# Patient Record
Sex: Female | Born: 1995 | Race: White | Hispanic: No | Marital: Single | Smoking: Never smoker
Health system: Southern US, Community
[De-identification: ages and names within clinical notes are randomized; demographics above are authoritative.]

## PROBLEM LIST (undated history)

## (undated) ENCOUNTER — Inpatient Hospital Stay (HOSPITAL_COMMUNITY): Payer: Self-pay

## (undated) DIAGNOSIS — N912 Amenorrhea, unspecified: Secondary | ICD-10-CM

## (undated) DIAGNOSIS — D649 Anemia, unspecified: Secondary | ICD-10-CM

## (undated) DIAGNOSIS — F329 Major depressive disorder, single episode, unspecified: Secondary | ICD-10-CM

## (undated) DIAGNOSIS — F419 Anxiety disorder, unspecified: Secondary | ICD-10-CM

## (undated) DIAGNOSIS — F32A Depression, unspecified: Secondary | ICD-10-CM

## (undated) HISTORY — DX: Anemia, unspecified: D64.9

## (undated) HISTORY — PX: NO PAST SURGERIES: SHX2092

## (undated) HISTORY — DX: Amenorrhea, unspecified: N91.2

## (undated) HISTORY — PX: DILATION AND CURETTAGE OF UTERUS: SHX78

## (undated) HISTORY — DX: Depression, unspecified: F32.A

## (undated) HISTORY — PX: WISDOM TOOTH EXTRACTION: SHX21

## (undated) HISTORY — DX: Anxiety disorder, unspecified: F41.9

## (undated) HISTORY — DX: Major depressive disorder, single episode, unspecified: F32.9

---

## 2018-03-07 NOTE — Progress Notes (Signed)
21 y.o. No obstetric history on file.  CaucasianF here for annual exam.  Period is late.   Cycles were monthly until April, then it was 3 weeks late. Now no cycle since May. No increase in stress, no thyroid c/o, no galactorrhea.  Sexually active, same partner x 1 year, using w/d for contraception. Just in the last 1-2 weeks she has had significant deep cramping pain in her pelvis that lasts for 5-10 minutes.   Period Duration (Days): 3-4 days Period Pattern: (!) Irregular Menstrual Flow: Light, Moderate Menstrual Control: Thin pad, Maxi pad, Tampon Menstrual Control Change Freq (Hours): changes pad/tampon eveyr 2 hours Dysmenorrhea: (!) Moderate Dysmenorrhea Symptoms: Cramping  Changes her products frequently, but not saturated. Can go through a super tampon in 3-4 hours for one day. More moody recently.   Patient's last menstrual period was 01/18/2018 (exact date).          Sexually active: Yes.    The current method of family planning is none.    Exercising: No.  The patient does not participate in regular exercise at present. Smoker:  no  Health Maintenance: Pap:  Never History of abnormal Pap:  no TDaP:  Unsure Gardasil: Completed all 3 per patient   reports that she has never smoked. She has never used smokeless tobacco. She reports that she drank alcohol. She reports that she has current or past drug history.  Past Medical History:  Diagnosis Date  . Amenorrhea   . Anemia   . Anxiety   . Depression   Depression/anxiety is better in the last few years. Anxiety is worse than her depression. Has tried therapy in the past, didn't really help. Has tried medication in the past.   History reviewed. No pertinent surgical history.  Current Outpatient Medications  Medication Sig Dispense Refill  . acetaminophen (TYLENOL) 500 MG tablet Take 500 mg by mouth every 6 (six) hours as needed.    Marland Kitchen. aspirin-acetaminophen-caffeine (EXCEDRIN MIGRAINE) 250-250-65 MG tablet Take 1 tablet by  mouth every 6 (six) hours as needed for headache.    . loratadine (CLARITIN) 10 MG tablet Take 10 mg by mouth daily as needed for allergies.     No current facility-administered medications for this visit.   No migraines, just gets headaches.   Family History  Problem Relation Age of Onset  . Breast cancer Paternal Grandmother   . Cervical cancer Paternal Grandmother     Review of Systems  Constitutional: Negative.   HENT: Negative.   Eyes: Negative.   Respiratory: Negative.   Cardiovascular: Negative.   Gastrointestinal: Positive for nausea and vomiting.  Endocrine: Negative.   Genitourinary:       Painful intercourse amenorrhea  Musculoskeletal: Negative.   Skin: Negative.   Allergic/Immunologic: Negative.   Neurological: Negative.   Hematological: Negative.   Psychiatric/Behavioral: Negative.   She c/o nausea and emesis since [place].   Exam:   BP 109/78 (BP Location: Right Arm, Patient Position: Sitting)   Pulse 68   Ht 5' 5.75" (1.67 m)   Wt 132 lb 3.2 oz (60 kg)   LMP 01/18/2018 (Exact Date)   BMI 21.50 kg/m   Weight change: @WEIGHTCHANGE @ Height:   Height: 5' 5.75" (167 cm)  Ht Readings from Last 3 Encounters:  03/08/18 5' 5.75" (1.67 m)    General appearance: alert, cooperative and appears stated age Head: Normocephalic, without obvious abnormality, atraumatic Neck: no adenopathy, supple, symmetrical, trachea midline and thyroid normal to inspection and palpation Lungs: clear to auscultation  bilaterally Cardiovascular: regular rate and rhythm Breasts: normal appearance, no masses or tenderness Abdomen: soft, non-tender; non distended,  no masses,  no organomegaly Extremities: extremities normal, atraumatic, no cyanosis or edema Skin: Skin color, texture, turgor normal. No rashes or lesions Lymph nodes: Cervical, supraclavicular, and axillary nodes normal. No abnormal inguinal nodes palpated Neurologic: Grossly normal   Pelvic: External genitalia:  no  lesions              Urethra:  normal appearing urethra with no masses, tenderness or lesions              Bartholins and Skenes: normal                 Vagina: normal appearing vagina with normal color and discharge, no lesions              Cervix: no lesions               Bimanual Exam:  Uterus:  anteverted and mobile, not tender, top normal sized              Adnexa: no mass, fullness, tenderness               Rectovaginal: Confirms               Anus:  normal sphincter tone, no lesions  Chaperone was present for exam.  A:  Well Woman with normal exam  Oligomenorrhea  Depression anxiety, better than it was, declines medication or counseling  Pelvic pain after intercourse, normal exam  P:   No pap until 22  Genprobe   UPT +  Will set up an ultrasound depending on HcG levels  BhcG, ABO  She isn't sure if she wants to continue the pregnancy, we discussed referral depending on her desires  STD testing

## 2018-03-08 ENCOUNTER — Other Ambulatory Visit: Payer: Self-pay

## 2018-03-08 ENCOUNTER — Ambulatory Visit (INDEPENDENT_AMBULATORY_CARE_PROVIDER_SITE_OTHER): Payer: BLUE CROSS/BLUE SHIELD | Admitting: Obstetrics and Gynecology

## 2018-03-08 ENCOUNTER — Encounter: Payer: Self-pay | Admitting: Obstetrics and Gynecology

## 2018-03-08 VITALS — BP 109/78 | HR 68 | Ht 65.75 in | Wt 132.2 lb

## 2018-03-08 DIAGNOSIS — Z01419 Encounter for gynecological examination (general) (routine) without abnormal findings: Secondary | ICD-10-CM

## 2018-03-08 DIAGNOSIS — Z3201 Encounter for pregnancy test, result positive: Secondary | ICD-10-CM | POA: Diagnosis not present

## 2018-03-08 DIAGNOSIS — N941 Unspecified dyspareunia: Secondary | ICD-10-CM | POA: Diagnosis not present

## 2018-03-08 DIAGNOSIS — Z113 Encounter for screening for infections with a predominantly sexual mode of transmission: Secondary | ICD-10-CM | POA: Diagnosis not present

## 2018-03-08 DIAGNOSIS — N912 Amenorrhea, unspecified: Secondary | ICD-10-CM

## 2018-03-08 LAB — POCT URINE PREGNANCY: Preg Test, Ur: POSITIVE — AB

## 2018-03-09 ENCOUNTER — Other Ambulatory Visit: Payer: Self-pay

## 2018-03-09 ENCOUNTER — Telehealth: Payer: Self-pay | Admitting: *Deleted

## 2018-03-09 ENCOUNTER — Telehealth: Payer: Self-pay | Admitting: Obstetrics and Gynecology

## 2018-03-09 ENCOUNTER — Encounter (HOSPITAL_COMMUNITY): Payer: Self-pay | Admitting: *Deleted

## 2018-03-09 ENCOUNTER — Other Ambulatory Visit: Payer: Self-pay | Admitting: *Deleted

## 2018-03-09 ENCOUNTER — Inpatient Hospital Stay (HOSPITAL_COMMUNITY)
Admission: AD | Admit: 2018-03-09 | Discharge: 2018-03-09 | Disposition: A | Discharge status: Home / Self Care | Payer: BLUE CROSS/BLUE SHIELD | Source: Ambulatory Visit | Attending: Obstetrics & Gynecology | Admitting: Obstetrics & Gynecology

## 2018-03-09 DIAGNOSIS — O210 Mild hyperemesis gravidarum: Secondary | ICD-10-CM | POA: Diagnosis not present

## 2018-03-09 DIAGNOSIS — Z3201 Encounter for pregnancy test, result positive: Secondary | ICD-10-CM

## 2018-03-09 DIAGNOSIS — Z3A01 Less than 8 weeks gestation of pregnancy: Secondary | ICD-10-CM | POA: Diagnosis not present

## 2018-03-09 DIAGNOSIS — N912 Amenorrhea, unspecified: Secondary | ICD-10-CM

## 2018-03-09 DIAGNOSIS — O209 Hemorrhage in early pregnancy, unspecified: Secondary | ICD-10-CM

## 2018-03-09 DIAGNOSIS — O219 Vomiting of pregnancy, unspecified: Secondary | ICD-10-CM | POA: Diagnosis not present

## 2018-03-09 LAB — HEPATITIS C ANTIBODY: Hep C Virus Ab: <0.1 s/co ratio (ref 0.0–0.9)

## 2018-03-09 LAB — BETA HCG QUANT (REF LAB): HCG QUANT: 27801 m[IU]/mL

## 2018-03-09 LAB — CBC
HCT: 37 % (ref 36.0–46.0)
HEMOGLOBIN: 12.8 g/dL (ref 12.0–15.0)
MCH: 31.6 pg (ref 26.0–34.0)
MCHC: 34.6 g/dL (ref 30.0–36.0)
MCV: 91.4 fL (ref 78.0–100.0)
PLATELETS: 264 10*3/uL (ref 150–400)
RBC: 4.05 MIL/uL (ref 3.87–5.11)
RDW: 12 % (ref 11.5–15.5)
WBC: 11.2 10*3/uL — AB (ref 4.0–10.5)

## 2018-03-09 LAB — GC/CHLAMYDIA PROBE AMP
CHLAMYDIA, DNA PROBE: NEGATIVE
NEISSERIA GONORRHOEAE BY PCR: NEGATIVE

## 2018-03-09 LAB — URINALYSIS, ROUTINE W REFLEX MICROSCOPIC
BILIRUBIN URINE: NEGATIVE
Glucose, UA: NEGATIVE mg/dL
Hgb urine dipstick: NEGATIVE
KETONES UR: 20 mg/dL — AB
LEUKOCYTES UA: NEGATIVE
NITRITE: NEGATIVE
PH: 6 (ref 5.0–8.0)
PROTEIN: NEGATIVE mg/dL
Specific Gravity, Urine: 1.015 (ref 1.005–1.030)

## 2018-03-09 LAB — ABO AND RH: RH TYPE: NEGATIVE

## 2018-03-09 LAB — HEP, RPR, HIV PANEL
HIV SCREEN 4TH GENERATION: NONREACTIVE
Hepatitis B Surface Ag: NEGATIVE
RPR Ser Ql: NONREACTIVE

## 2018-03-09 LAB — ANTIBODY SCREEN: Antibody Screen: NEGATIVE

## 2018-03-09 MED ORDER — PROMETHAZINE HCL 25 MG/ML IJ SOLN
25.0000 mg | Freq: Once | INTRAVENOUS | Status: AC
  Administered 2018-03-09: 25 mg via INTRAVENOUS
  Filled 2018-03-09: qty 1

## 2018-03-09 MED ORDER — DOXYLAMINE-PYRIDOXINE ER 20-20 MG PO TBCR: 1.0000 | EXTENDED_RELEASE_TABLET | Freq: Every day | ORAL | 0 refills | Status: DC

## 2018-03-09 MED ORDER — M.V.I. ADULT IV INJ
INJECTION | Freq: Once | INTRAVENOUS | Status: AC
  Administered 2018-03-09: 16:00:00 via INTRAVENOUS
  Filled 2018-03-09: qty 1000

## 2018-03-09 NOTE — MAU Note (Signed)
Pt presents with c/o N&V, states unable to keep anything down since Monday.  Reports has vomited @ least 10 times in 24 hours.  Hasn't taken any meds, Rx sent today by Dr. Oscar LaJertson. Denies VB or abdominal pain/cramping.

## 2018-03-09 NOTE — Telephone Encounter (Signed)
Notes recorded by Leda MinHamm, Clearence Vitug N, RN on 03/09/2018 at 9:07 AM EDT Spoke with patient, advised as seen below per Dr. Oscar LaJertson. PUS scheduled for 03/10/18 at 2:30 pm with consult to follow. Patient states she is undecided about plans for pregnancy, was waiting to confirm how far along she is, will plan to discuss further at OV on 7/11. Patient reports nausea and vomiting 5-6 times per day, unable to eat small meals or keep fluids down. Patient requesting RX for nausea. Advised will review with Dr. Oscar LaJertson and return call with recommendations, patient agreeable. See telephone encounter dated 7/10 to review with provider.   Order placed for PUS.      Dr. Oscar LaJertson -please review and advise on N/V.

## 2018-03-09 NOTE — Discharge Instructions (Signed)

## 2018-03-09 NOTE — MAU Provider Note (Signed)
History     CSN: [id number]  Arrival date and time: 03/09/18 1335   First Provider Initiated Contact with Patient 03/09/18 1510      Chief Complaint  Patient presents with  . Emesis  . Nausea   HPI  Ms.  Shelby Perez is a 22 y.o. year old 621P0000 female at 165w1d weeks gestation who presents to MAU reporting N/V and unable to keep anything down since Monday. She reports vomiting 10 times in 24 hrs and a 2 lb weight loss since her appt yesterday. She was Rx'd antiemetic medications, but has not had a chance to get them. She denies VB or abdominal pain/cramping. She is scheduled for an U/S in Dr. Salli QuarryJertson's office tomorrow (03/10/18).   Past Medical History:  Diagnosis Date  . Amenorrhea   . Anemia   . Anxiety   . Depression     Past Surgical History:  Procedure Laterality Date  . NO PAST SURGERIES      Family History  Problem Relation Age of Onset  . Breast cancer Paternal Grandmother   . Cervical cancer Paternal Grandmother     Social History   Tobacco Use  . Smoking status: Never Smoker  . Smokeless tobacco: Never Used  Substance Use Topics  . Alcohol use: Not Currently  . Drug use: Not Currently    Allergies: Not on File  Medications Prior to Admission  Medication Sig Dispense Refill Last Dose  . acetaminophen (TYLENOL) 500 MG tablet Take 500 mg by mouth every 6 (six) hours as needed.   Taking  . aspirin-acetaminophen-caffeine (EXCEDRIN MIGRAINE) 250-250-65 MG tablet Take 1 tablet by mouth every 6 (six) hours as needed for headache.   Taking  . Doxylamine-Pyridoxine ER (BONJESTA) 20-20 MG TBCR Take 1 tablet by mouth at bedtime. Increase to one tablet BID 60 tablet 0   . loratadine (CLARITIN) 10 MG tablet Take 10 mg by mouth daily as needed for allergies.   Taking    Review of Systems  Constitutional: Positive for fatigue and unexpected weight change ("was 132 lbs yesterday").  HENT: Negative.   Eyes: Negative.   Respiratory: Negative.    Cardiovascular: Negative.   Gastrointestinal: Positive for nausea and vomiting ("10 in 24 hrs; unable to keep anything down").  Endocrine: Negative.   Genitourinary: Negative.   Musculoskeletal: Negative.   Skin: Negative.   Allergic/Immunologic: Negative.   Neurological: Negative.   Hematological: Negative.   Psychiatric/Behavioral: Negative.    Physical Exam   Blood pressure 114/68, pulse 79, temperature 98.4 F (36.9 C), temperature source Oral, resp. rate 20, height 5\' 5"  (1.651 m), weight 130 lb (59 kg), last menstrual period 01/18/2018, SpO2 98 %.  Physical Exam  Nursing note and vitals reviewed. Constitutional: She is oriented to person, place, and time. She appears well-developed.  HENT:  Head: Normocephalic and atraumatic.  Eyes: Pupils are equal, round, and reactive to light.  Neck: Normal range of motion.  Cardiovascular: Normal rate, regular rhythm and normal heart sounds.  Respiratory: Effort normal and breath sounds normal.  GI: Soft. Normal appearance. Bowel sounds are decreased. There is no tenderness.  Genitourinary:  Genitourinary Comments: Pelvic deferred  Musculoskeletal: Normal range of motion.  Neurological: She is alert and oriented to person, place, and time.  Skin: Skin is warm and dry. There is pallor.  Psychiatric: She has a normal mood and affect. Her behavior is normal. Judgment and thought content normal.    MAU Course  Procedures  MDM CCUA CBC IVFs: Phenergan  25 mg in D5LR 1000 ml @ bolus rate; then MVI in LR 1000 ml @ 500 ml/hr  *Consult with Dr. Seymour Bars @ [phone] - notified of patient's complaints, assessments, lab results, tx plan d/c home, F/U with  - ok to d/c home, agrees with plan   Results for orders placed or performed during the hospital encounter of 03/09/18 (from the past 24 hour(s))  CBC     Status: Abnormal   Collection Time: 03/09/18  2:03 PM  Result Value Ref Range   WBC 11.2 (H) 4.0 - 10.5 K/uL   RBC 4.05 3.87 - 5.11  MIL/uL   Hemoglobin 12.8 12.0 - 15.0 g/dL   HCT 21.3 08.6 - 57.8 %   MCV 91.4 78.0 - 100.0 fL   MCH 31.6 26.0 - 34.0 pg   MCHC 34.6 30.0 - 36.0 g/dL   RDW 46.9 62.9 - 52.8 %   Platelets 264 150 - 400 K/uL  Urinalysis, Routine w reflex microscopic     Status: Abnormal   Collection Time: 03/09/18  2:39 PM  Result Value Ref Range   Color, Urine YELLOW YELLOW   APPearance CLEAR CLEAR   Specific Gravity, Urine 1.015 1.005 - 1.030   pH 6.0 5.0 - 8.0   Glucose, UA NEGATIVE NEGATIVE mg/dL   Hgb urine dipstick NEGATIVE NEGATIVE   Bilirubin Urine NEGATIVE NEGATIVE   Ketones, ur 20 (A) NEGATIVE mg/dL   Protein, ur NEGATIVE NEGATIVE mg/dL   Nitrite NEGATIVE NEGATIVE   Leukocytes, UA NEGATIVE NEGATIVE   WBC, UA 0-5 0 - 5 WBC/hpf   Bacteria, UA RARE (A) NONE SEEN   Squamous Epithelial / LPF 0-5 0 - 5   Mucus PRESENT       Assessment and Plan  Nausea and vomiting during pregnancy prior to [redacted] weeks gestation  - Information provided on morning sickness & HG - Advised to keep U/S appt tomorrow with Dr. Oscar La on 03/10/18 - Discharge patient - Patient verbalized an understanding of the plan of care and agrees.     Raelyn Mora MSN, CNM 03/09/2018, 2:40 PM

## 2018-03-09 NOTE — Telephone Encounter (Signed)
Spoke with patient, advised as seen below per Dr. Oscar LaJertson. Patient states she will go to MAU for evaluation today when she can find a driver. Rx for Bonjesta to verified pharmacy. Patient verbalizes understanding and is agreeable.   Routing to provider for final review. Patient is agreeable to disposition. Will close encounter.

## 2018-03-09 NOTE — Telephone Encounter (Signed)
-----   Message from Romualdo BolkJill Evelyn Jertson, MD sent at 03/09/2018  8:35 AM EDT ----- Please let the patient know that her BhcG is high enough for an ultrasound to determine how far along she is. Given her irregular bleeding in April and May she should return for an ultrasound (please get her in tomorrow). She if she has made a decision about continuing the pregnancy, please make sure the ultrasound tech is aware of her plans or indecision. Thanks She is RH -, if she has any further bleeding, she will need Rhogam. Her blood work for STD testing was negative, the genprobe is pending.

## 2018-03-09 NOTE — Telephone Encounter (Signed)
If she can't keep any fluids down, she should go to the MAU to be hydrated and given medication. She should eat a bland diet, try and stay hydrated and eat many small meals a day.  She can start on Bonjesta 1 tablet at bedtime tonight and increase to one tablet BID, #60 no refills. If she won't go to the MAU, you can call in phenergan 12.5 mg, 1-2 tablets po q 4 hours prn, # 20, no refills. She shouldn't take the medications together.

## 2018-03-09 NOTE — Telephone Encounter (Signed)
Call placed to patient to review benefits or scheduled ultrasound. Left voicemail message requesting a return call.

## 2018-03-10 ENCOUNTER — Ambulatory Visit (INDEPENDENT_AMBULATORY_CARE_PROVIDER_SITE_OTHER): Payer: BLUE CROSS/BLUE SHIELD | Admitting: Obstetrics and Gynecology

## 2018-03-10 ENCOUNTER — Other Ambulatory Visit: Payer: Self-pay

## 2018-03-10 ENCOUNTER — Encounter: Payer: Self-pay | Admitting: Obstetrics and Gynecology

## 2018-03-10 ENCOUNTER — Other Ambulatory Visit: Payer: Self-pay | Admitting: Obstetrics and Gynecology

## 2018-03-10 ENCOUNTER — Ambulatory Visit (INDEPENDENT_AMBULATORY_CARE_PROVIDER_SITE_OTHER): Payer: BLUE CROSS/BLUE SHIELD

## 2018-03-10 VITALS — BP 110/78 | HR 80 | Wt 131.0 lb

## 2018-03-10 DIAGNOSIS — N912 Amenorrhea, unspecified: Secondary | ICD-10-CM | POA: Diagnosis not present

## 2018-03-10 DIAGNOSIS — Z3490 Encounter for supervision of normal pregnancy, unspecified, unspecified trimester: Secondary | ICD-10-CM

## 2018-03-10 DIAGNOSIS — Z3201 Encounter for pregnancy test, result positive: Secondary | ICD-10-CM

## 2018-03-10 NOTE — Progress Notes (Signed)
GYNECOLOGY  VISIT   HPI: 22 y.o.   Single  Caucasian  female   G1P0000 with Patient's last menstrual period was 01/18/2018 (exact date).   here for viability [place] consult.  Went to MAU yesterday for nausea and vomiting. Was given IV fluid. Denies nausea and vomiting today. She is still trying to decide if she wants to continue the pregnancy. She is leaning toward having an abortion.   GYNECOLOGIC HISTORY: Patient's last menstrual period was 01/18/2018 (exact date). Contraception: None Menopausal hormone therapy: none        OB History    Gravida  1   Para  0   Term  0   Preterm  0   AB  0   Living  0     SAB  0   TAB  0   Ectopic  0   Multiple  0   Live Births  0              Patient Active Problem List   Diagnosis Date Noted  . Nausea and vomiting during pregnancy prior to [redacted] weeks gestation 03/09/2018    Past Medical History:  Diagnosis Date  . Amenorrhea   . Anemia   . Anxiety   . Depression     Past Surgical History:  Procedure Laterality Date  . NO PAST SURGERIES      Current Outpatient Medications  Medication Sig Dispense Refill  . acetaminophen (TYLENOL) 500 MG tablet Take 500 mg by mouth every 6 (six) hours as needed.    . Doxylamine-Pyridoxine ER (BONJESTA) 20-20 MG TBCR Take 1 tablet by mouth at bedtime. Increase to one tablet BID 60 tablet 0  . loratadine (CLARITIN) 10 MG tablet Take 10 mg by mouth daily as needed for allergies.     No current facility-administered medications for this visit.      ALLERGIES: Patient has no allergy information on record.  Family History  Problem Relation Age of Onset  . Breast cancer Paternal Grandmother   . Cervical cancer Paternal Grandmother     Social History   Socioeconomic History  . Marital status: Single    Spouse name: Not on file  . Number of children: Not on file  . Years of education: Not on file  . Highest education level: Not on file  Occupational History  . Not on file   Social Needs  . Financial resource strain: Not on file  . Food insecurity:    Worry: Not on file    Inability: Not on file  . Transportation needs:    Medical: Not on file    Non-medical: Not on file  Tobacco Use  . Smoking status: Never Smoker  . Smokeless tobacco: Never Used  Substance and Sexual Activity  . Alcohol use: Not Currently  . Drug use: Not Currently  . Sexual activity: Yes    Birth control/protection: None  Lifestyle  . Physical activity:    Days per week: Not on file    Minutes per session: Not on file  . Stress: Not on file  Relationships  . Social connections:    Talks on phone: Not on file    Gets together: Not on file    Attends religious service: Not on file    Active member of club or organization: Not on file    Attends meetings of clubs or organizations: Not on file    Relationship status: Not on file  . Intimate partner violence:  Fear of current or ex partner: Not on file    Emotionally abused: Not on file    Physically abused: Not on file    Forced sexual activity: Not on file  Other Topics Concern  . Not on file  Social History Narrative  . Not on file    Review of Systems  Constitutional: Negative.   HENT: Negative.   Eyes: Negative.   Respiratory: Negative.   Cardiovascular: Negative.   Gastrointestinal: Negative.   Genitourinary: Negative.   Musculoskeletal: Negative.   Skin: Negative.   Neurological: Negative.   Endo/Heme/Allergies: Negative.   Psychiatric/Behavioral: Negative.     PHYSICAL EXAMINATION:    LMP 01/18/2018 (Exact Date)     General appearance: alert, cooperative and appears stated age  Ultrasound with viable IUP  ASSESSMENT Viable IUP, patient unsure about continuing the pregnancy. Desires abortion counseling    [facility]AN [facility] for possible abortion. If she decides to continue the pregnancy she can do that there as well Aware to be on PNV unless she decides to  terminate   An After Visit Summary was printed and given to the patient.

## 2018-03-10 NOTE — Telephone Encounter (Signed)
Patient returning Suzy's call. °

## 2018-03-10 NOTE — Telephone Encounter (Signed)
Spoke with patient in regard to benefits for scheduled ultrasound appointment. Patient understood information presented. Patient is scheduled today 03/10/18 with Dr Oscar LaJertson. Patient advise she is on her way to the appointment now. Ok to close encounter

## 2018-03-15 ENCOUNTER — Telehealth: Payer: Self-pay | Admitting: Obstetrics and Gynecology

## 2018-03-15 NOTE — Telephone Encounter (Signed)
Spoke with patient, has decided to continue with her pregnancy, asking what to do next?   Advised patient she will need to establish care with OBGYN as soon as possible. Will place copy of list of providers in mail for her to review.   Dr. Oscar LaJertson will review, I will return call with any additional recommendations. Patient verbalizes understanding.   Routing to provider for final review. Patient is agreeable to disposition. Will close encounter.

## 2018-03-15 NOTE — Telephone Encounter (Signed)
Patient called to speak with the nurse. She said she is planning on proceeding with her pregnancy and she'd like some guidance.

## 2019-01-17 ENCOUNTER — Encounter (HOSPITAL_COMMUNITY): Payer: Self-pay

## 2019-09-01 NOTE — L&D Delivery Note (Signed)
Delivery Note   Patient Name: Berenis Kohles DOB: 03/08/1996 MRN: [id number]  Date of admission: 05/18/2020 Delivering MD: Dale Reading  Date of delivery: 05/18/20 Type of delivery: SVD  Newborn Data: Live born female  Birth Weight:   APGAR: 6, 9  Newborn Delivery   Birth date/time: 05/18/2020 21:35:00 Delivery type: Vaginal, Spontaneous    Giulliana Ladouceur, 23 y.o., @ [redacted]w[redacted]d,  [id number], who was admitted for spontaneous labor with IOL for postdates. I was called to the room when she progressed 2+ station in the second stage of labor.  DR Su Hilt called and in route for vacum assessment at bedside for deep variables, amnio infusing at 185ml/hr. She pushed for 30/min.  She delivered a viable infant, cephalic and restituted to the OA position over an intact perineum.  A body cord   was identified, loose and reduced. The baby was placed on maternal abdomen while initial step of NRP were perfmored (Dry, Stimulated, and warmed). Hat placed on baby for thermoregulation. Delayed cord clamping was performed for 2 mins.  Cord double clamped and cut.  Cord cut by father. Apgar scores were 6 and 9. Prophylactic Pitocin was started in the third stage of labor for active management. The placenta delivered spontaneously, duncan, with a 2 vessel cord and was sent to pathology. Brisk bleeding noted, fundal massage revieled boggy uterus, clotts noted to form in uterus, foley removed, TXA was hung manual exploration performed placenta fragments noted and expelled, IM methergine then given, Unasyn 3gm started,   Inspection revealed 2nd degree. An examination of the vaginal vault and cervix was free from lacerations. The uterus was firm, bleeding stable.  The repair was done under epidural.   Umbilical artery blood gas were not sent. But NICU called to be present for delivery.  There were no complications during the procedure.  Mom and baby skin to skin following delivery. Left in stable condition.  Maternal  Info: Anesthesia: Epidural Episiotomy: no Lacerations:  2nd degree Suture Repair: 3.0 vicryl CT Est. Blood Loss (mL):   Newborn Info:  Baby Sex: Baby Female Circumcision: in pt desired  APGAR (1 MIN): 6   APGAR (5 MINS): 9   APGAR (10 MINS):     Mom to postpartum.  Baby to Couplet care / Skin to Skin.  DR Su Hilt present after delivery.   [place], [place], NP-C 05/18/20 10:24 PM

## 2019-09-18 ENCOUNTER — Telehealth: Payer: Self-pay | Admitting: Obstetrics and Gynecology

## 2019-09-18 NOTE — Telephone Encounter (Signed)
Spoke to pt. Pt is newly pregnant around 7 wks.   LMP 08/02/2019. Pt states waiting to get an appt into OBGYN at [facility]. Pt states having nausea for last 6 weeks and today has no sx at all. Pt had "silent miscarriage in past" and is very worried and needs confirmation. Would like to see Dr Oscar La. Pt scheduled for OV 09/19/2019 at 1pm with Dr Oscar La. Pt agreeable and thankful for appt. Pt has Negative CPS.   Will route to Dr Oscar La for review and will close encounter.

## 2019-09-18 NOTE — Telephone Encounter (Signed)
Patient is newly pregnant and has some questions for Dr.Jerton about her "symtoms".

## 2019-09-19 ENCOUNTER — Telehealth: Payer: Self-pay

## 2019-09-19 ENCOUNTER — Ambulatory Visit (INDEPENDENT_AMBULATORY_CARE_PROVIDER_SITE_OTHER): Payer: 59 | Admitting: Obstetrics and Gynecology

## 2019-09-19 ENCOUNTER — Other Ambulatory Visit (INDEPENDENT_AMBULATORY_CARE_PROVIDER_SITE_OTHER): Payer: Medicaid Other

## 2019-09-19 ENCOUNTER — Other Ambulatory Visit: Payer: Medicaid Other

## 2019-09-19 ENCOUNTER — Other Ambulatory Visit: Payer: Self-pay

## 2019-09-19 ENCOUNTER — Encounter: Payer: Self-pay | Admitting: Obstetrics and Gynecology

## 2019-09-19 VITALS — BP 100/64 | HR 64 | Temp 98.4°F | Ht 65.5 in | Wt 125.0 lb

## 2019-09-19 DIAGNOSIS — N926 Irregular menstruation, unspecified: Secondary | ICD-10-CM

## 2019-09-19 DIAGNOSIS — O219 Vomiting of pregnancy, unspecified: Secondary | ICD-10-CM | POA: Diagnosis not present

## 2019-09-19 DIAGNOSIS — Z3201 Encounter for pregnancy test, result positive: Secondary | ICD-10-CM | POA: Diagnosis not present

## 2019-09-19 DIAGNOSIS — O26851 Spotting complicating pregnancy, first trimester: Secondary | ICD-10-CM

## 2019-09-19 DIAGNOSIS — R7989 Other specified abnormal findings of blood chemistry: Secondary | ICD-10-CM

## 2019-09-19 LAB — POCT URINE PREGNANCY: Preg Test, Ur: POSITIVE — AB

## 2019-09-19 MED ORDER — PROMETHAZINE HCL 12.5 MG PO TABS: ORAL_TABLET | ORAL | 0 refills | Status: DC

## 2019-09-19 NOTE — Progress Notes (Signed)
GYNECOLOGY  VISIT   HPI: 24 y.o.   Single White or Caucasian Not Hispanic or Latino  female   G1P0000 with Patient's last menstrual period was 08/01/2018.   here for   Positive pregnancy test. She states that she has lost some weight and nausea 2-3 times a day. She had vomited 3-4 x in the last week.  Cycles are every 35 days, + pregnancy test ~ 1 week ago. No bleeding, occasional mild cramps. She had spotting with intercourse a week ago. Stopped immediately. No h/o STD.   She is very excited.  She had a missed AB and D&C in 8/19.  GYNECOLOGIC HISTORY: Patient's last menstrual period was 08/01/2018. Contraception:none  Menopausal hormone therapy: none        OB History    Gravida  1   Para  0   Term  0   Preterm  0   AB  0   Living  0     SAB  0   TAB  0   Ectopic  0   Multiple  0   Live Births  0              Patient Active Problem List   Diagnosis Date Noted  . Nausea and vomiting during pregnancy prior to [redacted] weeks gestation 03/09/2018    Past Medical History:  Diagnosis Date  . Amenorrhea   . Anemia   . Anxiety   . Depression     Past Surgical History:  Procedure Laterality Date  . NO PAST SURGERIES      No current outpatient medications on file.   No current facility-administered medications for this visit.     ALLERGIES: Patient has no allergy information on record.  Family History  Problem Relation Age of Onset  . Breast cancer Paternal Grandmother   . Cervical cancer Paternal Grandmother     Social History   Socioeconomic History  . Marital status: Single    Spouse name: Not on file  . Number of children: Not on file  . Years of education: Not on file  . Highest education level: Not on file  Occupational History  . Not on file  Tobacco Use  . Smoking status: Never Smoker  . Smokeless tobacco: Never Used  Substance and Sexual Activity  . Alcohol use: Not Currently  . Drug use: Not Currently  . Sexual activity: Yes   Birth control/protection: None  Other Topics Concern  . Not on file  Social History Narrative  . Not on file   Social Determinants of Health   Financial Resource Strain:   . Difficulty of Paying Living Expenses: Not on file  Food Insecurity:   . Worried About Charity fundraiser in the Last Year: Not on file  . Ran Out of Food in the Last Year: Not on file  Transportation Needs:   . Lack of Transportation (Medical): Not on file  . Lack of Transportation (Non-Medical): Not on file  Physical Activity:   . Days of Exercise per Week: Not on file  . Minutes of Exercise per Session: Not on file  Stress:   . Feeling of Stress : Not on file  Social Connections:   . Frequency of Communication with Friends and Family: Not on file  . Frequency of Social Gatherings with Friends and Family: Not on file  . Attends Religious Services: Not on file  . Active Member of Clubs or Organizations: Not on file  . Attends Club  or Organization Meetings: Not on file  . Marital Status: Not on file  Intimate Partner Violence:   . Fear of Current or Ex-Partner: Not on file  . Emotionally Abused: Not on file  . Physically Abused: Not on file  . Sexually Abused: Not on file    Review of Systems  Constitutional:       Fatigue  Gastrointestinal: Positive for constipation, nausea and vomiting.  All other systems reviewed and are negative.   PHYSICAL EXAMINATION:    BP 100/64   Pulse 64   Temp 98.4 F (36.9 C)   Ht 5' 5.5" (1.664 m)   Wt 125 lb (56.7 kg)   LMP 08/01/2018   SpO2 98%   BMI 20.48 kg/m     General appearance: alert, cooperative and appears stated age Abdomen: soft, non-tender; non distended, no masses,  no organomegaly  Pelvic: External genitalia:  no lesions              Urethra:  normal appearing urethra with no masses, tenderness or lesions              Bartholins and Skenes: normal                 Vagina: normal appearing vagina with normal color and discharge, no lesions               Cervix: no lesions and not friable              Bimanual Exam:  Uterus:  retroverted, mobile, 8 week sized, not tender              Adnexa: no mass, fullness, tenderness               Chaperone was present for exam.  ASSESSMENT +pregnancy test 5+5 weeks based on LMP and 35 day cycles (not always), uterus feels enlarged.  Post coital spotting x 1 a week ago, cervix not friable Nausea and emesis   PLAN Check BhcG today, then depending on results schedule ultrasound. Blood type O-, rhogam not indicated for spotting. Given information on nausea and emesis in pregnancy, avoiding triggers, staying hydrated, vit B12 and unisom Phenergan sent in for prn use    An After Visit Summary was printed and given to the patient.

## 2019-09-19 NOTE — Telephone Encounter (Signed)
Spoke with patient. Due to PA needed for ultrasound in office reviewed with Dr.Jertson who recommends that the patient come in for a beta hcg quant only today. After results return we can make further plans for ultrasound if needed. Ultrasound for today cancelled. Patient is agreeable. Lab appointment scheduled for today 09/19/2019 at 3:45 pm. Patient is agreeable to date and time. Order for lab placed.  Routing to provider and will close encounter.

## 2019-09-20 ENCOUNTER — Telehealth: Payer: Self-pay | Admitting: *Deleted

## 2019-09-20 ENCOUNTER — Encounter: Payer: Self-pay | Admitting: Obstetrics and Gynecology

## 2019-09-20 ENCOUNTER — Other Ambulatory Visit: Payer: Self-pay | Admitting: *Deleted

## 2019-09-20 DIAGNOSIS — O219 Vomiting of pregnancy, unspecified: Secondary | ICD-10-CM

## 2019-09-20 DIAGNOSIS — Z3201 Encounter for pregnancy test, result positive: Secondary | ICD-10-CM

## 2019-09-20 DIAGNOSIS — N926 Irregular menstruation, unspecified: Secondary | ICD-10-CM

## 2019-09-20 DIAGNOSIS — O26851 Spotting complicating pregnancy, first trimester: Secondary | ICD-10-CM

## 2019-09-20 LAB — BETA HCG QUANT (REF LAB): hCG Quant: 13162 m[IU]/mL

## 2019-09-20 NOTE — Telephone Encounter (Signed)
Call placed to [facility] main radiology scheduling, spoke with Charmella. Was advised OB ultrasounds are now scheduled at [address] location.   Ultrasound scheduled for 09/27/19 at 9am, arrive at 8:45am. Patient needs to arrive with full bladder.

## 2019-09-20 NOTE — Telephone Encounter (Signed)
Call to patient, no answer, unable to leave voicemail. No alternative number.

## 2019-09-20 NOTE — Telephone Encounter (Signed)
Leda Min, RN  09/20/2019 10:44 AM EST    Spoke with patient, advised as seen below per Dr. Oscar La. Patient agreeable to proceed with PUS at [facility]. Advised patient I will call to schedule and return call with appt details. Patient agreeable.

## 2019-09-20 NOTE — Telephone Encounter (Signed)
-----   Message from Romualdo Bolk, MD sent at 09/20/2019 10:30 AM EST ----- Please inform the patient that her BhcG is in a good range and set her up for an ultrasound

## 2019-09-21 ENCOUNTER — Ambulatory Visit (HOSPITAL_COMMUNITY)
Admission: RE | Admit: 2019-09-21 | Discharge: 2019-09-21 | Disposition: A | Discharge status: Home / Self Care | Payer: 59 | Source: Ambulatory Visit | Attending: Obstetrics and Gynecology | Admitting: Obstetrics and Gynecology

## 2019-09-21 ENCOUNTER — Telehealth: Payer: Self-pay | Admitting: *Deleted

## 2019-09-21 ENCOUNTER — Other Ambulatory Visit: Payer: Self-pay

## 2019-09-21 DIAGNOSIS — N926 Irregular menstruation, unspecified: Secondary | ICD-10-CM

## 2019-09-21 DIAGNOSIS — O26851 Spotting complicating pregnancy, first trimester: Secondary | ICD-10-CM

## 2019-09-21 DIAGNOSIS — O219 Vomiting of pregnancy, unspecified: Secondary | ICD-10-CM | POA: Diagnosis present

## 2019-09-21 NOTE — Telephone Encounter (Signed)
Spoke with patient. Patient notified of ultrasound appt as seen below. Patient verbalizes understanding and is agreeable.   Routing to provider for final review. Patient is agreeable to disposition. Will close encounter.  Cc: Harland Dingwall, Soundra Pilon

## 2019-09-21 NOTE — Telephone Encounter (Signed)
Call to patient, advised per Dr. Oscar La. Advised RN will call to reschedule and return call with appt information. Patient agreeable.   Call placed to [facility] Main scheduling, spoke with Darl Pikes. OB US r/s to today at 1pm at [facility] outpatient [place] located at [address]. Arrive at 12:45pm with full bladder. Business office notified.   Call to patient, advised of new [place] appt and details, patient verbalizes understanding and is agreeable.   Routing to Dr. Oscar La  Cc: Harland Dingwall, Soundra Pilon

## 2019-09-21 NOTE — Telephone Encounter (Signed)
She had slight spotting last week. I don't think she should wait a week for a scan, please try and get her in today if possible.

## 2019-09-21 NOTE — Telephone Encounter (Signed)
Leda Min, RN  09/21/2019 4:18 PM EST    Call to patient, no answer, voicemail not set up. No alternative number on file, MyChart not active.

## 2019-09-21 NOTE — Telephone Encounter (Signed)
-----   Message from Romualdo Bolk, MD sent at 09/21/2019  2:30 PM EST ----- Please inform the patient that her ultrasound was normal and have her schedule an appointment with OB.

## 2019-09-21 NOTE — Addendum Note (Signed)
Addended by: Leda Min on: 09/21/2019 10:54 AM   Modules accepted: Orders

## 2019-09-22 NOTE — Telephone Encounter (Signed)
Spoke with patient, advised per Dr. Jertson. Patient verbalizes understanding and is agreeable.   Encounter closed.  

## 2019-09-27 ENCOUNTER — Other Ambulatory Visit (HOSPITAL_COMMUNITY): Payer: BLUE CROSS/BLUE SHIELD

## 2019-11-02 LAB — OB RESULTS CONSOLE RPR: RPR: NONREACTIVE

## 2019-11-02 LAB — OB RESULTS CONSOLE RUBELLA ANTIBODY, IGM: Rubella: IMMUNE

## 2019-11-02 LAB — OB RESULTS CONSOLE GC/CHLAMYDIA
Chlamydia: NEGATIVE
Gonorrhea: NEGATIVE

## 2019-11-02 LAB — OB RESULTS CONSOLE HEPATITIS B SURFACE ANTIGEN: Hepatitis B Surface Ag: NEGATIVE

## 2019-11-02 LAB — OB RESULTS CONSOLE HIV ANTIBODY (ROUTINE TESTING): HIV: NONREACTIVE

## 2019-11-28 ENCOUNTER — Other Ambulatory Visit (HOSPITAL_COMMUNITY): Payer: Self-pay | Admitting: Obstetrics & Gynecology

## 2019-11-28 DIAGNOSIS — Z3A20 20 weeks gestation of pregnancy: Secondary | ICD-10-CM

## 2019-11-28 DIAGNOSIS — Z3689 Encounter for other specified antenatal screening: Secondary | ICD-10-CM

## 2019-12-14 ENCOUNTER — Other Ambulatory Visit: Payer: Self-pay

## 2019-12-14 ENCOUNTER — Emergency Department
Admission: EM | Admit: 2019-12-14 | Discharge: 2019-12-14 | Disposition: A | Discharge status: Home / Self Care | Payer: 59 | Attending: Emergency Medicine | Admitting: Emergency Medicine

## 2019-12-14 DIAGNOSIS — L237 Allergic contact dermatitis due to plants, except food: Secondary | ICD-10-CM | POA: Insufficient documentation

## 2019-12-14 DIAGNOSIS — O26892 Other specified pregnancy related conditions, second trimester: Secondary | ICD-10-CM | POA: Insufficient documentation

## 2019-12-14 DIAGNOSIS — Z3A20 20 weeks gestation of pregnancy: Secondary | ICD-10-CM | POA: Insufficient documentation

## 2019-12-14 MED ORDER — TRIAMCINOLONE ACETONIDE 0.025 % EX OINT: 1.0000 "application " | TOPICAL_OINTMENT | Freq: Every day | CUTANEOUS | 0 refills | Status: DC

## 2019-12-14 NOTE — ED Notes (Signed)
See triage note  Presents with poison ivy to top of both feet  States she has been using Calamine lotion w/o relief

## 2019-12-14 NOTE — ED Triage Notes (Signed)
Pt reports poison ivy to bilateral foot X 3 week. Blisters and redness present.

## 2019-12-14 NOTE — ED Provider Notes (Signed)
Emergency Department Provider Note  ____________________________________________  Time seen: Approximately 6:26 PM  I have reviewed the triage vital signs and the nursing notes.   HISTORY  Chief Complaint Poison Lajoyce Corners   Historian Patient     HPI Shelby Perez is a 24 y.o. female presents to the emergency department with poison oak dermatitis along the dorsal aspect of the bilateral feet with large bulla formation.  Patient states that she has had symptoms for the past 2 weeks.  She has been using over-the-counter medications and states that they have not helped significantly.  Patient is currently [redacted] weeks pregnant.  She denies abdominal pain, vaginal bleeding or gush of vaginal fluids.  Rashes not localized to other areas of the body besides the feet.   Past Medical History:  Diagnosis Date  . Amenorrhea   . Anemia   . Anxiety   . Depression      Immunizations up to date:  Yes.     Past Medical History:  Diagnosis Date  . Amenorrhea   . Anemia   . Anxiety   . Depression     Patient Active Problem List   Diagnosis Date Noted  . Nausea and vomiting during pregnancy prior to [redacted] weeks gestation 03/09/2018    Past Surgical History:  Procedure Laterality Date  . NO PAST SURGERIES      Prior to Admission medications   Medication Sig Start Date End Date Taking? Authorizing Provider  triamcinolone (KENALOG) 0.025 % ointment Apply 1 application topically daily. Apply one daily for seven days. 12/14/19   Orvil Feil, PA-C    Allergies Patient has no known allergies.  Family History  Problem Relation Age of Onset  . Breast cancer Paternal Grandmother   . Cervical cancer Paternal Grandmother     Social History Social History   Tobacco Use  . Smoking status: Never Smoker  . Smokeless tobacco: Never Used  Substance Use Topics  . Alcohol use: Not Currently  . Drug use: Not Currently     Review of Systems  Constitutional: No fever/chills Eyes:   No discharge ENT: No upper respiratory complaints. Respiratory: no cough. No SOB/ use of accessory muscles to breath Gastrointestinal:   No nausea, no vomiting.  No diarrhea.  No constipation. Musculoskeletal: Negative for musculoskeletal pain. Skin: Patient has poison oak dermatitis.    ____________________________________________   PHYSICAL EXAM:  VITAL SIGNS: ED Triage Vitals [12/14/19 1711]  Enc Vitals Group     BP 112/76     Pulse Rate (!) 106     Resp 18     Temp 98 F (36.7 C)     Temp Source Oral     SpO2 98 %     Weight 145 lb (65.8 kg)     Height 5\' 5"  (1.651 m)     Head Circumference      Peak Flow      Pain Score 5     Pain Loc      Pain Edu?      Excl. in GC?      Constitutional: Alert and oriented. Well appearing and in no acute distress. Eyes: Conjunctivae are normal. PERRL. EOMI. Head: Atraumatic. Cardiovascular: Normal rate, regular rhythm. Normal S1 and S2.  Good peripheral circulation. Respiratory: Normal respiratory effort without tachypnea or retractions. Lungs CTAB. Good air entry to the bases with no decreased or absent breath sounds Gastrointestinal: Bowel sounds x 4 quadrants. Soft and nontender to palpation. No guarding or rigidity. No distention.  Musculoskeletal: Full range of motion to all extremities. No obvious deformities noted Neurologic:  Normal for age. No gross focal neurologic deficits are appreciated.  Skin: Patient has erythema along the dorsal aspect of the bilateral feet with large bulla formation.  Rash follows distribution of exposed skin in sandals patient was wearing at time of poison oak contact. Psychiatric: Mood and affect are normal for age. Speech and behavior are normal.   ____________________________________________   LABS (all labs ordered are listed, but only abnormal results are displayed)  Labs Reviewed - No data to  display ____________________________________________  EKG   ____________________________________________  RADIOLOGY   No results found.  ____________________________________________    PROCEDURES  Procedure(s) performed:     Procedures     Medications - No data to display   ____________________________________________   INITIAL IMPRESSION / ASSESSMENT AND PLAN / ED COURSE  Pertinent labs & imaging results that were available during my care of the patient were reviewed by me and considered in my medical decision making (see chart for details).      Assessment and plan Poison oak dermatitis 24 year old female presents to the emergency department with poison oak dermatitis along the dorsal aspect of the bilateral feet.  Reviewed management approaches and patient elected to try topical triamcinolone.  Triamcinolone was prescribed at discharge.  Return precautions were given to return with new or worsening symptoms.  All patient questions were answered.    ____________________________________________  FINAL CLINICAL IMPRESSION(S) / ED DIAGNOSES  Final diagnoses:  Poison oak      NEW MEDICATIONS STARTED DURING THIS VISIT:  ED Discharge Orders         Ordered    triamcinolone (KENALOG) 0.025 % ointment  Daily     12/14/19 1747              This chart was dictated using voice recognition software/Dragon. Despite best efforts to proofread, errors can occur which can change the meaning. Any change was purely unintentional.     Lannie Fields, PA-C 12/14/19 1830    Earleen Newport, MD 12/14/19 1950

## 2019-12-26 ENCOUNTER — Ambulatory Visit (HOSPITAL_COMMUNITY)
Admission: RE | Admit: 2019-12-26 | Discharge: 2019-12-26 | Disposition: A | Discharge status: Home / Self Care | Payer: 59 | Source: Ambulatory Visit | Attending: Obstetrics and Gynecology | Admitting: Obstetrics and Gynecology

## 2019-12-26 ENCOUNTER — Other Ambulatory Visit: Payer: Self-pay

## 2019-12-26 ENCOUNTER — Other Ambulatory Visit (HOSPITAL_COMMUNITY): Payer: Self-pay | Admitting: Obstetrics & Gynecology

## 2019-12-26 DIAGNOSIS — Z363 Encounter for antenatal screening for malformations: Secondary | ICD-10-CM | POA: Diagnosis not present

## 2019-12-26 DIAGNOSIS — Z3A20 20 weeks gestation of pregnancy: Secondary | ICD-10-CM

## 2019-12-26 DIAGNOSIS — Z3689 Encounter for other specified antenatal screening: Secondary | ICD-10-CM | POA: Insufficient documentation

## 2019-12-26 DIAGNOSIS — O283 Abnormal ultrasonic finding on antenatal screening of mother: Secondary | ICD-10-CM | POA: Diagnosis not present

## 2019-12-27 ENCOUNTER — Encounter (HOSPITAL_COMMUNITY): Payer: Self-pay | Admitting: Obstetrics & Gynecology

## 2019-12-27 ENCOUNTER — Other Ambulatory Visit (HOSPITAL_COMMUNITY): Payer: Self-pay | Admitting: *Deleted

## 2019-12-27 DIAGNOSIS — O09899 Supervision of other high risk pregnancies, unspecified trimester: Secondary | ICD-10-CM

## 2020-01-22 ENCOUNTER — Encounter: Payer: Self-pay | Admitting: *Deleted

## 2020-01-23 ENCOUNTER — Other Ambulatory Visit: Payer: Self-pay

## 2020-01-23 ENCOUNTER — Ambulatory Visit (HOSPITAL_COMMUNITY): Payer: 59 | Attending: Obstetrics and Gynecology

## 2020-01-23 ENCOUNTER — Ambulatory Visit: Payer: 59 | Admitting: *Deleted

## 2020-01-23 ENCOUNTER — Encounter: Payer: Self-pay | Admitting: *Deleted

## 2020-01-23 ENCOUNTER — Other Ambulatory Visit: Payer: Self-pay | Admitting: *Deleted

## 2020-01-23 VITALS — BP 124/74 | HR 105

## 2020-01-23 DIAGNOSIS — Z3A24 24 weeks gestation of pregnancy: Secondary | ICD-10-CM | POA: Diagnosis not present

## 2020-01-23 DIAGNOSIS — O09899 Supervision of other high risk pregnancies, unspecified trimester: Secondary | ICD-10-CM | POA: Diagnosis not present

## 2020-01-23 DIAGNOSIS — Z362 Encounter for other antenatal screening follow-up: Secondary | ICD-10-CM

## 2020-01-23 DIAGNOSIS — Q270 Congenital absence and hypoplasia of umbilical artery: Secondary | ICD-10-CM

## 2020-01-23 DIAGNOSIS — O283 Abnormal ultrasonic finding on antenatal screening of mother: Secondary | ICD-10-CM

## 2020-02-27 ENCOUNTER — Ambulatory Visit: Payer: 59 | Attending: Maternal & Fetal Medicine

## 2020-02-27 ENCOUNTER — Encounter (INDEPENDENT_AMBULATORY_CARE_PROVIDER_SITE_OTHER): Payer: Self-pay

## 2020-02-27 ENCOUNTER — Other Ambulatory Visit: Payer: Self-pay

## 2020-02-27 ENCOUNTER — Ambulatory Visit: Payer: 59 | Admitting: *Deleted

## 2020-02-27 VITALS — BP 121/75 | HR 81

## 2020-02-27 DIAGNOSIS — O09899 Supervision of other high risk pregnancies, unspecified trimester: Secondary | ICD-10-CM | POA: Insufficient documentation

## 2020-02-27 DIAGNOSIS — O283 Abnormal ultrasonic finding on antenatal screening of mother: Secondary | ICD-10-CM

## 2020-02-27 DIAGNOSIS — Z362 Encounter for other antenatal screening follow-up: Secondary | ICD-10-CM | POA: Diagnosis not present

## 2020-02-27 DIAGNOSIS — Q270 Congenital absence and hypoplasia of umbilical artery: Secondary | ICD-10-CM | POA: Insufficient documentation

## 2020-02-27 DIAGNOSIS — Z3A29 29 weeks gestation of pregnancy: Secondary | ICD-10-CM

## 2020-04-09 LAB — OB RESULTS CONSOLE GBS: GBS: NEGATIVE

## 2020-05-14 ENCOUNTER — Other Ambulatory Visit: Payer: Self-pay | Admitting: Obstetrics and Gynecology

## 2020-05-14 ENCOUNTER — Telehealth (HOSPITAL_COMMUNITY): Payer: Self-pay | Admitting: *Deleted

## 2020-05-14 NOTE — Telephone Encounter (Signed)
Preadmission screen  

## 2020-05-15 ENCOUNTER — Telehealth (HOSPITAL_COMMUNITY): Payer: Self-pay | Admitting: *Deleted

## 2020-05-15 NOTE — Telephone Encounter (Signed)
Preadmission screen  

## 2020-05-16 ENCOUNTER — Other Ambulatory Visit (HOSPITAL_COMMUNITY)
Admission: RE | Admit: 2020-05-16 | Discharge: 2020-05-16 | Disposition: A | Discharge status: Home / Self Care | Payer: 59 | Source: Ambulatory Visit | Attending: Obstetrics and Gynecology | Admitting: Obstetrics and Gynecology

## 2020-05-16 DIAGNOSIS — Z01812 Encounter for preprocedural laboratory examination: Secondary | ICD-10-CM | POA: Insufficient documentation

## 2020-05-16 DIAGNOSIS — Z20822 Contact with and (suspected) exposure to covid-19: Secondary | ICD-10-CM | POA: Insufficient documentation

## 2020-05-16 LAB — SARS CORONAVIRUS 2 (TAT 6-24 HRS): SARS Coronavirus 2: NEGATIVE

## 2020-05-17 ENCOUNTER — Encounter (HOSPITAL_COMMUNITY): Payer: Self-pay | Admitting: *Deleted

## 2020-05-17 ENCOUNTER — Telehealth (HOSPITAL_COMMUNITY): Payer: Self-pay | Admitting: *Deleted

## 2020-05-17 NOTE — Telephone Encounter (Signed)
Preadmission screen  

## 2020-05-18 ENCOUNTER — Inpatient Hospital Stay (HOSPITAL_COMMUNITY): Payer: 59

## 2020-05-18 ENCOUNTER — Inpatient Hospital Stay (HOSPITAL_COMMUNITY)
Admission: AD | Admit: 2020-05-18 | Discharge: 2020-05-20 | DRG: 807 | Disposition: A | Discharge status: Home / Self Care | Payer: 59 | Attending: Obstetrics and Gynecology | Admitting: Obstetrics and Gynecology

## 2020-05-18 ENCOUNTER — Inpatient Hospital Stay (HOSPITAL_COMMUNITY): Payer: 59 | Admitting: Anesthesiology

## 2020-05-18 ENCOUNTER — Encounter (HOSPITAL_COMMUNITY): Payer: Self-pay | Admitting: Obstetrics and Gynecology

## 2020-05-18 ENCOUNTER — Other Ambulatory Visit: Payer: Self-pay

## 2020-05-18 DIAGNOSIS — Z3A41 41 weeks gestation of pregnancy: Secondary | ICD-10-CM

## 2020-05-18 DIAGNOSIS — O480 Post-term pregnancy: Principal | ICD-10-CM | POA: Diagnosis present

## 2020-05-18 DIAGNOSIS — O26893 Other specified pregnancy related conditions, third trimester: Secondary | ICD-10-CM | POA: Diagnosis present

## 2020-05-18 DIAGNOSIS — Z6791 Unspecified blood type, Rh negative: Secondary | ICD-10-CM

## 2020-05-18 LAB — TYPE AND SCREEN
ABO/RH(D): O NEG
Antibody Screen: POSITIVE

## 2020-05-18 LAB — RPR: RPR Ser Ql: NONREACTIVE

## 2020-05-18 LAB — CBC
HCT: 34.1 % — ABNORMAL LOW (ref 36.0–46.0)
Hemoglobin: 11.7 g/dL — ABNORMAL LOW (ref 12.0–15.0)
MCH: 33.7 pg (ref 26.0–34.0)
MCHC: 34.3 g/dL (ref 30.0–36.0)
MCV: 98.3 fL (ref 80.0–100.0)
Platelets: 246 10*3/uL (ref 150–400)
RBC: 3.47 MIL/uL — ABNORMAL LOW (ref 3.87–5.11)
RDW: 11.7 % (ref 11.5–15.5)
WBC: 10.4 10*3/uL (ref 4.0–10.5)
nRBC: 0 % (ref 0.0–0.2)

## 2020-05-18 MED ORDER — ACETAMINOPHEN 325 MG PO TABS: 650.0000 mg | ORAL_TABLET | ORAL | Status: DC | PRN

## 2020-05-18 MED ORDER — ONDANSETRON HCL 4 MG/2ML IJ SOLN: 4.0000 mg | Freq: Four times a day (QID) | INTRAMUSCULAR | Status: DC | PRN

## 2020-05-18 MED ORDER — LACTATED RINGERS IV SOLN: INTRAVENOUS | Status: DC

## 2020-05-18 MED ORDER — TERBUTALINE SULFATE 1 MG/ML IJ SOLN
INTRAMUSCULAR | Status: AC
  Filled 2020-05-18: qty 1

## 2020-05-18 MED ORDER — PHENYLEPHRINE 40 MCG/ML (10ML) SYRINGE FOR IV PUSH (FOR BLOOD PRESSURE SUPPORT): 80.0000 ug | PREFILLED_SYRINGE | INTRAVENOUS | Status: DC | PRN

## 2020-05-18 MED ORDER — LACTATED RINGERS IV SOLN
500.0000 mL | INTRAVENOUS | Status: DC | PRN
  Administered 2020-05-18: 500 mL via INTRAVENOUS

## 2020-05-18 MED ORDER — FENTANYL-BUPIVACAINE-NACL 0.5-0.125-0.9 MG/250ML-% EP SOLN
12.0000 mL/h | EPIDURAL | Status: DC | PRN
  Filled 2020-05-18: qty 250

## 2020-05-18 MED ORDER — LACTATED RINGERS IV SOLN: 500.0000 mL | Freq: Once | INTRAVENOUS | Status: DC

## 2020-05-18 MED ORDER — EPHEDRINE 5 MG/ML INJ: 10.0000 mg | INTRAVENOUS | Status: DC | PRN

## 2020-05-18 MED ORDER — LACTATED RINGERS AMNIOINFUSION: INTRAVENOUS | Status: DC

## 2020-05-18 MED ORDER — TRANEXAMIC ACID-NACL 1000-0.7 MG/100ML-% IV SOLN
INTRAVENOUS | Status: AC
  Filled 2020-05-18: qty 100

## 2020-05-18 MED ORDER — LIDOCAINE HCL (PF) 1 % IJ SOLN: 30.0000 mL | INTRAMUSCULAR | Status: DC | PRN

## 2020-05-18 MED ORDER — SODIUM CHLORIDE 0.9 % IV SOLN
3.0000 g | Freq: Once | INTRAVENOUS | Status: AC
  Administered 2020-05-18: 3 g via INTRAVENOUS
  Filled 2020-05-18: qty 8

## 2020-05-18 MED ORDER — FENTANYL CITRATE (PF) 100 MCG/2ML IJ SOLN: 50.0000 ug | INTRAMUSCULAR | Status: DC | PRN

## 2020-05-18 MED ORDER — BUPIVACAINE HCL (PF) 0.75 % IJ SOLN
INTRAMUSCULAR | Status: DC | PRN
Start: 2020-05-18 — End: 2020-05-18
  Administered 2020-05-18: 12 mL/h via EPIDURAL

## 2020-05-18 MED ORDER — OXYTOCIN BOLUS FROM INFUSION
333.0000 mL | Freq: Once | INTRAVENOUS | Status: AC
  Administered 2020-05-18: 333 mL via INTRAVENOUS

## 2020-05-18 MED ORDER — OXYTOCIN-SODIUM CHLORIDE 30-0.9 UT/500ML-% IV SOLN
2.5000 [IU]/h | INTRAVENOUS | Status: DC
  Filled 2020-05-18: qty 500

## 2020-05-18 MED ORDER — TERBUTALINE SULFATE 1 MG/ML IJ SOLN: 0.2500 mg | Freq: Once | INTRAMUSCULAR | Status: DC | PRN

## 2020-05-18 MED ORDER — METHYLERGONOVINE MALEATE 0.2 MG/ML IJ SOLN
0.2000 mg | Freq: Once | INTRAMUSCULAR | Status: AC
  Administered 2020-05-18: 0.2 mg via INTRAMUSCULAR

## 2020-05-18 MED ORDER — DIPHENHYDRAMINE HCL 50 MG/ML IJ SOLN: 12.5000 mg | INTRAMUSCULAR | Status: DC | PRN

## 2020-05-18 MED ORDER — METHYLERGONOVINE MALEATE 0.2 MG/ML IJ SOLN
INTRAMUSCULAR | Status: AC
  Filled 2020-05-18: qty 1

## 2020-05-18 MED ORDER — SOD CITRATE-CITRIC ACID 500-334 MG/5ML PO SOLN: 30.0000 mL | ORAL | Status: DC | PRN

## 2020-05-18 MED ORDER — TRANEXAMIC ACID-NACL 1000-0.7 MG/100ML-% IV SOLN
1000.0000 mg | INTRAVENOUS | Status: AC
  Administered 2020-05-18: 1000 mg via INTRAVENOUS

## 2020-05-18 MED ORDER — MISOPROSTOL 25 MCG QUARTER TABLET
25.0000 ug | ORAL_TABLET | ORAL | Status: DC | PRN
  Administered 2020-05-18 (×2): 25 ug via VAGINAL
  Filled 2020-05-18 (×2): qty 1

## 2020-05-18 MED ORDER — LIDOCAINE HCL (PF) 1 % IJ SOLN
INTRAMUSCULAR | Status: DC | PRN
  Administered 2020-05-18: 11 mL via EPIDURAL

## 2020-05-18 NOTE — Anesthesia Procedure Notes (Signed)
Epidural Patient location during procedure: OB Start time: 05/18/2020 2:37 PM End time: 05/18/2020 2:50 PM  Staffing Anesthesiologist: Lowella Curb, MD Performed: anesthesiologist   Preanesthetic Checklist Completed: patient identified, IV checked, site marked, risks and benefits discussed, surgical consent, monitors and equipment checked, pre-op evaluation and timeout performed  Epidural Patient position: sitting Prep: ChloraPrep Patient monitoring: heart rate, cardiac monitor, continuous pulse ox and blood pressure Approach: midline Location: L2-L3 Injection technique: LOR saline  Needle:  Needle type: Tuohy  Needle gauge: 17 G Needle length: 9 cm Needle insertion depth: 5 cm Catheter type: closed end flexible Catheter size: 20 Guage Catheter at skin depth: 9 cm Test dose: negative  Assessment Events: blood not aspirated, injection not painful, no injection resistance, no paresthesia and negative IV test  Additional Notes Reason for block:procedure for pain

## 2020-05-18 NOTE — Anesthesia Preprocedure Evaluation (Signed)

## 2020-05-18 NOTE — H&P (Signed)
OB ADMISSION/ HISTORY & PHYSICAL:  Admission Date: 05/18/2020 12:02 AM  Admit Diagnosis: Post-dates pregnancy [O48.0]    Shelby Perez is a 24 y.o. female presenting for IOL due to postdates. Denies contractions, vaginal bleeding, or leaking of fluid. Endorses + fetal movement. Husband, Shelby Perez, at the bedside and supportive. Expecting baby boy, [name].  Prenatal History: G3P0020   EDC : 05/08/2020, by Last Menstrual Period  Prenatal care at [facility] since 7 weeks  Prenatal course complicated by: 1. Rh negative, Rhogam at 30 weeks 2. Single umbilical artery 3. History of depression, stable off meds  Prenatal Labs: ABO, Rh:   O NEG Antibody: POS (09/18 0124) Rubella: Immune (03/04 0000)  RPR: Nonreactive (03/04 0000)  HBsAg: Negative (03/04 0000)  HIV: Non-reactive (03/04 0000)  GBS: Negative/-- (08/10 0000)  1 hr Glucola : 117 Genetic Screening: Panorama low risk make, Horizon negative, AFP negative Ultrasound: normal anatomy with the exception of 2 vessel umbilical cord, vertex, anterior placenta, AGA, AFI WNL    Maternal Diabetes: No Genetic Screening: Normal Maternal Ultrasounds/Referrals: Normal Fetal Ultrasounds or other Referrals:  None Maternal Substance Abuse:  No Significant Maternal Medications:  None Significant Maternal Lab Results:  Group B Strep negative and Rh negative Other Comments:  None  Medical / Surgical History :  Past medical history:  Past Medical History:  Diagnosis Date  . Amenorrhea   . Anemia   . Anxiety   . Depression     Past surgical history:  Past Surgical History:  Procedure Laterality Date  . DILATION AND CURETTAGE OF UTERUS    . WISDOM TOOTH EXTRACTION      Family History:  Family History  Problem Relation Age of Onset  . Breast cancer Paternal Grandmother   . Cervical cancer Paternal Grandmother     Social History:  reports that she has never smoked. She has never used smokeless tobacco. She reports previous alcohol use. She  reports previous drug use.  Allergies: Patient has no known allergies.   Current Medications at time of admission:  Medications Prior to Admission  Medication Sig Dispense Refill Last Dose  . Prenatal Vit-Fe Fumarate-FA (PRENATAL VITAMINS PO) Take by mouth.     . triamcinolone (KENALOG) 0.025 % ointment Apply 1 application topically daily. Apply one daily for seven days. (Patient not taking: Reported on 02/27/2020) 30 g 0     Review of Systems: Review of Systems  All other systems reviewed and are negative.  Physical Exam: Vital signs and nursing notes reviewed.  Patient Vitals for the past 24 hrs:  BP Temp Temp src Pulse Height Weight  05/18/20 0124 129/75 97.8 F (36.6 C) Oral 70 5\' 5"  (1.651 m) 88.3 kg    General: AAO x 3, NAD, sleeping Heart: RRR Lungs:CTAB Abdomen: Gravid, NT, Leopold's 7-8 lbs Extremities: no edema Genitalia / VE: Dilation: 1.5 Effacement (%): 50 Cervical Position: Posterior Station: -3 Presentation: Vertex Exam by:: Mariah Basiliere RN   FHR: 125BPM, mod variability, + accels, no decels TOCO: Ctx occasional  Labs:   Pending T&S, CBC, RPR  Recent Labs    05/18/20 0124  WBC 10.4  HGB 11.7*  HCT 34.1*  PLT 246   Assessment:  23 y.o. G3P0020 at [redacted]w[redacted]d, 2 vessel umbilical cord  1. IOL for postdates 2. FHR category 1  3. GBS negative 4. Desires epidural in active labor 5. Plans to breastfeed 6. Placenta disposal per patient request 7. History of depression, stable off meds  Plan:  1. Admit to BS  2. Routine L&D orders 3. Analgesia/anesthesia PRN  4. Cytotec vaginally q 4 hours for cervical ripening. Will consider cervical balloon at next SVE 5. Anticipate NSVB 6. Close follow-up postpartum for risk of PPD  Dr. Normand Sloop notified of admission / plan of care  June Leap CNM, MSN 05/18/2020, 2:58 AM

## 2020-05-18 NOTE — Progress Notes (Addendum)
Labor Progress Note  Subjective: Pt comfortable post epidural. Ready for a nap.  Patient Active Problem List   Diagnosis Date Noted   Post-dates pregnancy 05/18/2020   Nausea and vomiting during pregnancy prior to [redacted] weeks gestation 03/09/2018   Objective: BP 125/68    Pulse 71    Temp 97.8 F (36.6 C) (Axillary)    Resp 18    Ht 5\' 5"  (1.651 m)    Wt 88.3 kg    LMP 08/02/2019    BMI 32.40 kg/m  No intake/output data recorded. No intake/output data recorded. NST: FHR baseline 130 bpm, Variability: moderate, Accelerations:present, Decelerations:  Early decel with a couple lates noted= Cat 2/Reactive CTX:  regular, every 2-3 minutes, lasting 60-80 seconds Uterus gravid, soft non tender, moderate to palpate with contractions.  SVE:  Dilation: 7 Effacement (%): 100 Station: 0, Plus 1 Exam by:: [facility] CNM   It was apparent pt had SROM with clear fluids at some point during the day.   Assessment:  Shelby Perez, 24 y.o., 11-21-2004, with an IUP @ [redacted]w[redacted]d, presented for IOL for lat term @ 41.3, RH-, received rhogam at 30 weeks, 2VC, h/o depression no meds, mood anxious and tearful. Currently progressing in active labor after two Cytotec and SROM.   Patient Active Problem List   Diagnosis Date Noted   Post-dates pregnancy 05/18/2020   Nausea and vomiting during pregnancy prior to [redacted] weeks gestation 03/09/2018   NICHD: Category 2  Repetitive early and a few lates noted, after SROM and epidural placement. Fluid bolus and matneral position  changed resolved decels.   Membranes:  SROM, Clear 9/18 @ 1400, no s/s of infection  Induction:    Cytotec x @ 0211 and 0532  Pain management:               IV pain management: xPRN             Epidural placement: x Placed 9/18 @ 1445  GBS Negative   Plan: Continue labor plan Continuous monitoring Rest Frequent position changes to facilitate fetal rotation and descent. Will reassess with cervical exam at 1800 or earlier if  necessary Anticipate labor progression and vaginal delivery.    10/18, NP-C, CNM, MSN 05/18/2020. 4:28 PM

## 2020-05-18 NOTE — Progress Notes (Signed)
Labor Progress Note  Subjective: Pt feeling pressure abdominally and in pelvis area, variables noted on fetal strip, called by RN for bedside assessment.  At 1730 decel variables noted to nadir of 70s, position change to hand and knees, trendelenburg with abdominal massage performed, I discusses R/B/A of FSE and IUPC with starting an amnio due to continued deep variables to nadir of 60s with quick return to baseline. Pt verbilly consented to all three procedures. Variables to nadir of 60s continued pt then changed to left side and right, currently pt now sitting straight up with NS amnio completed and 140mls/hr running now with resolution of variables. Dr Su Hilt called and reviewed the strip. Discussed if pt become fully dilated and variables are noted with pushing and fetus is 2+ station a vacuome can be offered, discussed R/B/A of vacuome:  Risks and benefits discussed in detail.  Risks include, but are not limited to the risks of anesthesia, bleeding, infection, damage to maternal tissues, fetal cephalhematoma.  There is also the risk of inability to effect vaginal delivery of the head, or shoulder dystocia that cannot be resolved by established maneuvers, leading to the need for emergency cesarean section. Also discussed emergent PCS for fetal intolerance if fetal decels continue. Pt expressed concerns and desires to have a vaginal delivery.   Patient Active Problem List   Diagnosis Date Noted   Post-dates pregnancy 05/18/2020   Nausea and vomiting during pregnancy prior to [redacted] weeks gestation 03/09/2018   Objective: BP 114/70    Pulse 78    Temp 99.4 F (37.4 C) (Oral)    Resp 18    Ht 5\' 5"  (1.651 m)    Wt 88.3 kg    LMP 08/02/2019    BMI 32.40 kg/m  No intake/output data recorded. No intake/output data recorded. NST: FHR baseline 145 bpm, Variability: moderate, Accelerations:present, Decelerations: Variables noted to nadir of 60s= Cat 2/Reactive CTX:  regular, every 2-4 minutes,  lasting 60-80 seconds Uterus gravid, soft non tender, moderate to palpate with contractions.  SVE:  Dilation: 9 Effacement (%): 100 Station: 0, Plus 1 Exam by:: [facility] CNM   Fetal sutures appear to be transverse, using maternal position change to facilitate presenting part movement.  IUPC placed with ease, MVU 180s FSE placed with ease, fetus tolerated well with reactive strip.   Assessment:  Shelby Perez, 23 y.o., 11-21-2004, with an IUP @ [redacted]w[redacted]d, presented for IOL for lat term @ 41.3, RH-, received rhogam at 30 weeks, 2VC, h/o depression no meds, mood anxious and tearful. Currently progressing in active labor after two Cytotec and SROM.   Patient Active Problem List   Diagnosis Date Noted   Post-dates pregnancy 05/18/2020   Nausea and vomiting during pregnancy prior to [redacted] weeks gestation 03/09/2018   NICHD: Category 2  Repetitive variables noted, maternal position change, bolus, fse, and IUPC placed and amnio started. DR 05/10/2018  aware.     Membranes:  SROM, Clear 9/18 @ 1400, no s/s of infection  IUPC: MVUs 180s, Amnioinfusion 300mg  bolus started.  FSE in place  Induction:    Cytotec x @ 0211 and 0532  Pain management:               IV pain management: xPRN             Epidural placement: x Placed 9/18 @ 1445  GBS Negative   Plan: Continue labor plan Continuous monitoring Rest Frequent position changes to facilitate fetal rotation and descent. Will reassess  with cervical exam at 2000 or earlier if necessary Amnioinfusion for variables: Continue to infuse 189ml/hr.  Anticipate labor progression and vaginal delivery.    Dale Unicoi, NP-C, CNM, MSN 05/18/2020. 6:17 PM

## 2020-05-18 NOTE — Progress Notes (Signed)
Labor Progress Note  Subjective: Pt in bed, crying not tolerating pain well. Pt endorsing feeling cxt. Pt stable, has support partner and mother in room.  Patient Active Problem List   Diagnosis Date Noted  . Post-dates pregnancy 05/18/2020  . Nausea and vomiting during pregnancy prior to [redacted] weeks gestation 03/09/2018   Objective: BP 116/82   Pulse 77   Temp 97.9 F (36.6 C) (Oral)   Resp 20   Ht 5\' 5"  (1.651 m)   Wt 88.3 kg   LMP 08/02/2019   BMI 32.40 kg/m  No intake/output data recorded. No intake/output data recorded. NST: FHR baseline 130 bpm, Variability: moderate, Accelerations:present, Decelerations:  Absent= Cat 1/Reactive CTX:  regular, every 2-3 minutes, lasting 60-80 seconds Uterus gravid, soft non tender, moderate to palpate with contractions.  SVE:  Dilation: 3 Effacement (%): 80 Station: -1 Exam by:: [facility], CNM   Assessment:  Shelby Perez, 23 y.o., G3P0020, with an IUP @ [redacted]w[redacted]d, presented for IOL for lat term @ 41.3, RH-, received rhogam at 30 weeks, 2VC, h/o depression no meds, mood anxious and tearful. Pt progressing latent labor at 3cm after two cytotec, discussed POC and induction steps, pt desires expectant management at this time to reassess later.  Patient Active Problem List   Diagnosis Date Noted  . Post-dates pregnancy 05/18/2020  . Nausea and vomiting during pregnancy prior to [redacted] weeks gestation 03/09/2018   NICHD: Category 1  Membranes:  Intact, no s/s of infection  Induction:    Cytotec x @ 0211 and 0532  Pain management:               IV pain management: xPRN, encouraged and discussed R/B/A             Epidural placement: xPRN, encouraged and discussed R/B/A  GBS Negative   Plan: Continue labor plan Continuous/intermittent monitoring Rest Ambulate Frequent position changes to facilitate fetal rotation and descent. Will reassess with cervical exam at 1400 or earlier if necessary Anticipate labor progression and vaginal  delivery.    05/10/2018, NP-C, CNM, MSN 05/18/2020. 10:24 AM

## 2020-05-18 NOTE — Progress Notes (Signed)
May intermittent monitor per [facility], CNM

## 2020-05-19 ENCOUNTER — Encounter (HOSPITAL_COMMUNITY): Payer: Self-pay | Admitting: Obstetrics and Gynecology

## 2020-05-19 LAB — CBC
HCT: 30.2 % — ABNORMAL LOW (ref 36.0–46.0)
Hemoglobin: 10.4 g/dL — ABNORMAL LOW (ref 12.0–15.0)
MCH: 33.3 pg (ref 26.0–34.0)
MCHC: 34.4 g/dL (ref 30.0–36.0)
MCV: 96.8 fL (ref 80.0–100.0)
Platelets: 201 10*3/uL (ref 150–400)
RBC: 3.12 MIL/uL — ABNORMAL LOW (ref 3.87–5.11)
RDW: 11.9 % (ref 11.5–15.5)
WBC: 16 10*3/uL — ABNORMAL HIGH (ref 4.0–10.5)
nRBC: 0 % (ref 0.0–0.2)

## 2020-05-19 MED ORDER — TETANUS-DIPHTH-ACELL PERTUSSIS 5-2.5-18.5 LF-MCG/0.5 IM SUSP: 0.5000 mL | Freq: Once | INTRAMUSCULAR | Status: DC

## 2020-05-19 MED ORDER — COCONUT OIL OIL: 1.0000 "application " | TOPICAL_OIL | Status: DC | PRN

## 2020-05-19 MED ORDER — SIMETHICONE 80 MG PO CHEW: 80.0000 mg | CHEWABLE_TABLET | ORAL | Status: DC | PRN

## 2020-05-19 MED ORDER — RHO D IMMUNE GLOBULIN 1500 UNIT/2ML IJ SOSY
300.0000 ug | PREFILLED_SYRINGE | Freq: Once | INTRAMUSCULAR | Status: AC
  Administered 2020-05-19: 300 ug via INTRAVENOUS
  Filled 2020-05-19: qty 2

## 2020-05-19 MED ORDER — ZOLPIDEM TARTRATE 5 MG PO TABS: 5.0000 mg | ORAL_TABLET | Freq: Every evening | ORAL | Status: DC | PRN

## 2020-05-19 MED ORDER — DIPHENHYDRAMINE HCL 25 MG PO CAPS: 25.0000 mg | ORAL_CAPSULE | Freq: Four times a day (QID) | ORAL | Status: DC | PRN

## 2020-05-19 MED ORDER — IBUPROFEN 600 MG PO TABS
600.0000 mg | ORAL_TABLET | Freq: Four times a day (QID) | ORAL | Status: DC
  Administered 2020-05-19 – 2020-05-20 (×7): 600 mg via ORAL
  Filled 2020-05-19 (×7): qty 1

## 2020-05-19 MED ORDER — SENNOSIDES-DOCUSATE SODIUM 8.6-50 MG PO TABS
2.0000 | ORAL_TABLET | ORAL | Status: DC
  Administered 2020-05-19 – 2020-05-20 (×2): 2 via ORAL
  Filled 2020-05-19 (×2): qty 2

## 2020-05-19 MED ORDER — BENZOCAINE-MENTHOL 20-0.5 % EX AERO
1.0000 "application " | INHALATION_SPRAY | CUTANEOUS | Status: DC | PRN
  Filled 2020-05-19: qty 56

## 2020-05-19 MED ORDER — PRENATAL MULTIVITAMIN CH
1.0000 | ORAL_TABLET | Freq: Every day | ORAL | Status: DC
  Administered 2020-05-19 – 2020-05-20 (×2): 1 via ORAL
  Filled 2020-05-19 (×2): qty 1

## 2020-05-19 MED ORDER — METHYLERGONOVINE MALEATE 0.2 MG/ML IJ SOLN: 0.2000 mg | INTRAMUSCULAR | Status: DC | PRN

## 2020-05-19 MED ORDER — WITCH HAZEL-GLYCERIN EX PADS: 1.0000 "application " | MEDICATED_PAD | CUTANEOUS | Status: DC | PRN

## 2020-05-19 MED ORDER — ONDANSETRON HCL 4 MG PO TABS: 4.0000 mg | ORAL_TABLET | ORAL | Status: DC | PRN

## 2020-05-19 MED ORDER — METHYLERGONOVINE MALEATE 0.2 MG PO TABS: 0.2000 mg | ORAL_TABLET | ORAL | Status: DC | PRN

## 2020-05-19 MED ORDER — ONDANSETRON HCL 4 MG/2ML IJ SOLN: 4.0000 mg | INTRAMUSCULAR | Status: DC | PRN

## 2020-05-19 MED ORDER — DIBUCAINE (PERIANAL) 1 % EX OINT: 1.0000 "application " | TOPICAL_OINTMENT | CUTANEOUS | Status: DC | PRN

## 2020-05-19 MED ORDER — ACETAMINOPHEN 325 MG PO TABS: 650.0000 mg | ORAL_TABLET | ORAL | Status: DC | PRN

## 2020-05-19 NOTE — Anesthesia Postprocedure Evaluation (Signed)
Anesthesia Post Note  Patient: Shelby Perez  Procedure(s) Performed: AN AD HOC LABOR EPIDURAL     Patient location during evaluation: Mother Baby Anesthesia Type: Epidural Level of consciousness: awake and alert, oriented and patient cooperative Pain management: pain level controlled Vital Signs Assessment: post-procedure vital signs reviewed and stable Respiratory status: spontaneous breathing Cardiovascular status: stable Postop Assessment: no headache, epidural receding, patient able to bend at knees and no signs of nausea or vomiting Anesthetic complications: no Comments: Pt. States she is walking.  Pain score 1.    No complications documented.  Last Vitals:  Vitals:   05/19/20 0133 05/19/20 0532  BP: 117/76 103/62  Pulse: 75 75  Resp: 18 16  Temp: 36.4 C 36.8 C  SpO2: 99% 98%    Last Pain:  Vitals:   05/19/20 0532  TempSrc: Oral  PainSc: 0-No pain   Pain Goal:                   [facility]

## 2020-05-19 NOTE — Progress Notes (Signed)
PPD# 1 SVD w/ 2nd degree laceration Information for the patient's newborn:  [place], Boy Marlaine [[id number]]  female    Baby Name [name] Circumcision declines   S:   Reports feeling good, lower back and tailbone are sore Tolerating PO fluid and solids No nausea or vomiting Bleeding is light Pain controlled with PO meds Up ad lib / ambulatory / voiding w/o difficulty Feeding: Breast    O:   VS: BP 113/65 (BP Location: Right Arm)   Pulse 91   Temp 98.1 F (36.7 C) (Oral)   Resp 18   Ht 5\' 5" (1.651 m)   Wt 88.3 kg   LMP 08/02/2019   SpO2 100%   Breastfeeding Unknown   BMI 32.40 kg/m   LABS:  Recent Labs    05/18/20 0124 05/19/20 0734  WBC 10.4 16.0*  HGB 11.7* 10.4*  PLT 246 201   Blood type: --/--/O NEG (09/19 0734) Rubella: Immune (03/04 0000)                      I&O: Intake/Output      09 /18 0701 - 09/19 0700 09/19 0701 - 09/20 0700   I.V. (mL/kg) 0 (0)    Other 0    IV Piggyback 0    Total Intake(mL/kg) 0 (0)    Urine (mL/kg/hr) 450 (0.2)    Blood 828    Total Output 1278    Net -1278           Physical Exam: Alert and oriented X3 Lungs: Clear and unlabored Heart: regular rate and rhythm / no mumurs Abdomen: soft, non-tender, non-distended  Fundus: firm, non-tender Perineum: well-approximated, edematous Lochia: appropriate Extremities: trace edema, no calf pain or tenderness    A:  PPD # 1  Normal exam Rh negative Hx of depression    -stable, takes no meds  P:  Routine post partum orders Rhogam if needed Anticipate D/C on 05/20/20  Plan reviewed w/ Dr. 05/22/20, MSN, CNM 05/19/2020, 11:48 AM

## 2020-05-19 NOTE — Lactation Note (Signed)
This note was copied from a baby's chart. Lactation Consultation Note  Patient Name: Shelby Perez Today's Date: 05/19/2020 Reason for consult: Initial assessment;Term;Primapara;1st time breastfeeding  P1 mother whose infant is now 85 hours old.  This is a term baby at 41+3 weeks.  Baby was swaddled and asleep when I arrived.  Mother was happy to report that he has latched a couple of times since birth.  Her breasts are soft and non tender and nipples are short shafted, everted and intact.    Encouraged to feed 8-12 times/24 hours or sooner if baby shows feeding cues.  Reviewed cues and taught hand expression.  Mother was unable to express colostrum drops at this time.  Colostrum container provided and milk storage times reviewed.  Finger feeding demonstrated.  Suggested mother call her RN/LC for latch assistance as needed.  Mother stated that her RN has helped her with latching today.  She has done a lot of reading about breast feeding and is very excited to exclusively breast feed her child.    Mom made aware of O/P services, breastfeeding support groups, community resources, and our phone # for post-discharge questions. Mother has a DEBP for home use.  Father present.   Maternal Data Formula Feeding for Exclusion: No Has patient been taught Hand Expression?: Yes Does the patient have breastfeeding experience prior to this delivery?: No  Feeding Feeding Type: Breast Fed  LATCH Score Latch: Repeated attempts needed to sustain latch, nipple held in mouth throughout feeding, stimulation needed to elicit sucking reflex.  Audible Swallowing: A few with stimulation  Type of Nipple: Everted at rest and after stimulation  Comfort (Breast/Nipple): Soft / non-tender  Hold (Positioning): Assistance needed to correctly position infant at breast and maintain latch.  LATCH Score: 7  Interventions    Lactation Tools Discussed/Used WIC Program: No   Consult Status Consult  Status: Follow-up Date: 05/20/20 Follow-up type: In-patient    Marilyn Wing R Chantalle Defilippo 05/19/2020, 11:29 AM

## 2020-05-19 NOTE — Progress Notes (Signed)
MOB was referred for history of depression/anxiety. * Referral screened out by Clinical Social Worker because none of the following criteria appear to apply: ~ History of anxiety/depression during this pregnancy, or of post-partum depression following prior delivery. ~ Diagnosis of anxiety and/or depression within last 3 years. No concerns noted in MOB's OB records.  OR * MOB's symptoms currently being treated with medication and/or therapy.  Please contact the Clinical Social Worker if needs arise, by MOB request, or if MOB scores greater than 9/yes to question 10 on Edinburgh Postpartum Depression Screen.  Teja Costen Boyd-Gilyard, MSW, LCSW Clinical Social Work [phone] 

## 2020-05-20 LAB — RH IG WORKUP (INCLUDES ABO/RH)
ABO/RH(D): O NEG
Fetal Screen: NEGATIVE
Gestational Age(Wks): 41.3
Unit division: 0

## 2020-05-20 MED ORDER — IBUPROFEN 600 MG PO TABS
600.0000 mg | ORAL_TABLET | Freq: Four times a day (QID) | ORAL | 0 refills | Status: DC
Start: 2020-05-20 — End: 2021-01-02

## 2020-05-20 NOTE — Lactation Note (Signed)
This note was copied from a baby's chart. Lactation Consultation Note  Patient Name: Shelby Perez Today's Date: 05/20/2020 Reason for consult: Follow-up assessment  P1 mother whose infant is now 81 hours old.  This is a term baby at 41+3 weeks.  Baby has been latching and feeding.  Mother questioning whether or not baby has a tongue tie.  She is not sure if he is getting latched deeply enough even when she uses techniques to assist with obtaining a deep latch.  Mother has been supplementing with donor breast milk.  Mother will be discharged today and still awaiting a pediatrician's visit.  Suggested she ask the pediatrician to assess.  Also offered to return at the next feeding so I may observe and assist with latching.  Mother happy to call me back.when he is ready to feed.  RN updated and will call for my assistance.   Maternal Data    Feeding Feeding Type: Breast Fed  LATCH Score Latch: Grasps breast easily, tongue down, lips flanged, rhythmical sucking.  Audible Swallowing: Spontaneous and intermittent  Type of Nipple: Flat  Comfort (Breast/Nipple): Soft / non-tender  Hold (Positioning): Assistance needed to correctly position infant at breast and maintain latch.  LATCH Score: 8  Interventions    Lactation Tools Discussed/Used     Consult Status Consult Status: Complete Date: 05/20/20 Follow-up type: In-patient    Shelby Perez 05/20/2020, 11:05 AM

## 2020-05-20 NOTE — Discharge Summary (Signed)
SVD OB Discharge Summary     Patient Name: Shelby Perez DOB: 1995-10-11 MRN: [id number]  Date of admission: 05/18/2020 Delivering MD: Dale [place]  Date of delivery: 05/18/2020 Type of delivery: SVD  Newborn Data: Sex: Baby female Circumcision: Declined Live born female  Birth Weight: 7 lb 6.9 oz (3371 g) APGAR: 6, 9  Newborn Delivery   Birth date/time: 05/18/2020 21:35:00 Delivery type: Vaginal, Spontaneous      Feeding: breast and bottle Infant being discharge to home with mother in stable condition.   Admitting diagnosis: Post-dates pregnancy [O48.0] Intrauterine pregnancy: [redacted]w[redacted]d     Secondary diagnosis:  Active Problems:   Post-dates pregnancy   SVD (9/18)   Second degree laceration of perineum, delivered, current hospitalization                                Complications: None                                                              Intrapartum Procedures: spontaneous vaginal delivery Postpartum Procedures: antibiotics, Rho(D) Ig and x1 dose abx for maunal placenta fragment reomval, afebrile.  Complications-Operative and Postpartum: 2nd degree perineal laceration Augmentation: Cytotec   History of Present Illness: Ms. Shelby Perez is a 24 y.o. female, [id number], who presents at [redacted]w[redacted]d weeks gestation. The patient has been followed at  [facility]  Her pregnancy has been complicated by:  Patient Active Problem List   Diagnosis Date Noted  . SVD (9/18) 05/19/2020  . Second degree laceration of perineum, delivered, current hospitalization 05/19/2020  . Post-dates pregnancy 05/18/2020  . Nausea and vomiting during pregnancy prior to [redacted] weeks gestation 03/09/2018    Hospital course:  Induction of Labor With Vaginal Delivery   24 y.o. yo [id number] at [redacted]w[redacted]d was admitted to the hospital 05/18/2020 for induction of labor.  Indication for induction: Postdates.  Patient had an uncomplicated labor course as follows: Membrane Rupture  Time/Date: 3:23 PM ,05/18/2020   Delivery Method:Vaginal, Spontaneous  Episiotomy: None  Lacerations:  2nd degree  Details of delivery can be found in separate delivery note.  Patient had a routine postpartum course. Patient is discharged home 05/20/20.  Newborn Data: Birth date:05/18/2020  Birth time:9:35 PM  Gender:Female  Living status:Living  Apgars:6 ,9  Weight:3371 g  Postpartum Day # 2 : S/P NSVD due to IOL for postdates. Patient up ad lib, denies syncope or dizziness. Reports consuming regular diet without issues and denies N/V. Patient reports 0 bowel movement + passing flatus.  Denies issues with urination and reports bleeding is "lighter."  Patient is breastfeeding and reports going well.  Desires undecided for postpartum contraception.  Pain is being appropriately managed with use of po meds. H/O depression no meds, mood stable, declined meds now, denies SI/HI, monitor mood 2 week f/u with ccob for mood check.   Physical exam  Vitals:   05/19/20 0845 05/19/20 1330 05/19/20 1934 05/20/20 0546  BP: 113/65 106/70 98/68 106/74  Pulse: 91 95 79 80  Resp: 18 18 15 18   Temp: 98.1 F (36.7 C) 98.3 F (36.8 C) 98 F (36.7 C) 98.2 F (36.8 C)  TempSrc: Oral Oral Oral Oral  SpO2: 100% 98% 99%  99%  Weight:      Height:       General: alert, cooperative and no distress Lochia: appropriate Uterine Fundus: firm Perineum: Approximate, no hematomas DVT Evaluation: No evidence of DVT seen on physical exam. Negative Homan's sign. No cords or calf tenderness. No significant calf/ankle edema.  Labs: Lab Results  Component Value Date   WBC 16.0 (H) 05/19/2020   HGB 10.4 (L) 05/19/2020   HCT 30.2 (L) 05/19/2020   MCV 96.8 05/19/2020   PLT 201 05/19/2020   No flowsheet data found.  Date of discharge: 05/20/2020 Discharge Diagnoses: Post-date pregnancy Discharge instruction: per After Visit Summary and "Baby and Me Booklet".  After visit meds:   Activity:           unrestricted  and pelvic rest Advance as tolerated. Pelvic rest for 6 weeks.  Diet:                routine Medications: PNV and Ibuprofen Postpartum contraception: Undecided Condition:  Pt discharge to home with baby in stable  Meds: Allergies as of 05/20/2020   No Known Allergies     Medication List    TAKE these medications   ibuprofen 600 MG tablet Commonly known as: ADVIL Take 1 tablet (600 mg total) by mouth every 6 (six) hours.   PRENATAL VITAMINS PO Take by mouth.   triamcinolone 0.025 % ointment Commonly known as: KENALOG Apply 1 application topically daily. Apply one daily for seven days.       Discharge Follow Up:   Follow-up Information    [facility]. Schedule an appointment as soon as possible for a visit in 2 week(s).   Specialty: Obstetrics and Gynecology Why: 2 weeks mood check and 6 week PPV Contact information: [address] [place] [address] [phone]               [place], NP-C, CNM 05/20/2020, 11:22 AM  [place] , FNP

## 2020-05-20 NOTE — Lactation Note (Addendum)
This note was copied from a baby's chart. Lactation Consultation Note Central RN asked LC to see mom d/t baby hasn't had a stool since birth 48 hrs old. Voiding and had lots of spit ups but no stool. Mom stated baby is cluster feeding. Baby sleeping. Mom holding baby. Mom stated she doesn't feel like he is opening wide enough. Suggested chin tug. Mom stated she tries. Mom stated she wonders if he has lip or tongue tie would like LC to assess but not at this time d/t him sleeping.  W/mom's permission assessed nipples. Compressible intact. No bruising or trauma noted to nipples. Mom states it hurts during feedings.  Encouraged mom to call for assistance when needed.  Patient Name: Shelby Perez Today's Date: 05/20/2020     Maternal Data    Feeding Feeding Type: Donor Breast Milk  LATCH Score                   Interventions    Lactation Tools Discussed/Used     Consult Status      Charyl Dancer 05/20/2020, 3:32 AM

## 2020-05-21 ENCOUNTER — Ambulatory Visit: Payer: Self-pay

## 2020-05-21 LAB — SURGICAL PATHOLOGY

## 2020-05-21 NOTE — Lactation Note (Signed)
This note was copied from a baby's chart. Lactation Consultation Note  Patient Name: Shelby Perez Today's Date: 05/21/2020   Mother and baby resting.  Spoke with FOB who recently gave baby donor milk. He states mother has DEBP at home.   Encouraged him to call [facility] when mother and baby wake.      Maternal Data    Feeding Feeding Type: Breast Fed  LATCH Score Latch: Grasps breast easily, tongue down, lips flanged, rhythmical sucking.  Audible Swallowing: Spontaneous and intermittent  Type of Nipple: Everted at rest and after stimulation  Comfort (Breast/Nipple): Filling, red/small blisters or bruises, mild/mod discomfort  Hold (Positioning): Assistance needed to correctly position infant at breast and maintain latch.  LATCH Score: 8  Interventions    Lactation Tools Discussed/Used     Consult Status      Dahlia Byes [facility] 05/21/2020, 10:09 AM

## 2020-06-04 ENCOUNTER — Other Ambulatory Visit: Payer: Self-pay

## 2020-06-04 ENCOUNTER — Inpatient Hospital Stay (HOSPITAL_COMMUNITY)
Admission: AD | Admit: 2020-06-04 | Discharge: 2020-06-05 | Disposition: A | Discharge status: Home / Self Care | Payer: Medicaid Other | Attending: Obstetrics and Gynecology | Admitting: Obstetrics and Gynecology

## 2020-06-04 ENCOUNTER — Encounter (HOSPITAL_COMMUNITY): Payer: Self-pay | Admitting: Emergency Medicine

## 2020-06-04 DIAGNOSIS — D649 Anemia, unspecified: Secondary | ICD-10-CM | POA: Diagnosis not present

## 2020-06-04 DIAGNOSIS — Z791 Long term (current) use of non-steroidal anti-inflammatories (NSAID): Secondary | ICD-10-CM | POA: Insufficient documentation

## 2020-06-04 DIAGNOSIS — Z79899 Other long term (current) drug therapy: Secondary | ICD-10-CM | POA: Insufficient documentation

## 2020-06-04 DIAGNOSIS — L299 Pruritus, unspecified: Secondary | ICD-10-CM | POA: Insufficient documentation

## 2020-06-04 DIAGNOSIS — R102 Pelvic and perineal pain: Secondary | ICD-10-CM | POA: Diagnosis present

## 2020-06-04 DIAGNOSIS — N368 Other specified disorders of urethra: Secondary | ICD-10-CM

## 2020-06-04 LAB — URINALYSIS, ROUTINE W REFLEX MICROSCOPIC
Bacteria, UA: NONE SEEN
Bilirubin Urine: NEGATIVE
Glucose, UA: NEGATIVE mg/dL
Ketones, ur: NEGATIVE mg/dL
Nitrite: NEGATIVE
Protein, ur: NEGATIVE mg/dL
Specific Gravity, Urine: 1.021 (ref 1.005–1.030)
pH: 5 (ref 5.0–8.0)

## 2020-06-04 LAB — I-STAT BETA HCG BLOOD, ED (MC, WL, AP ONLY): I-stat hCG, quantitative: <5 m[IU]/mL (ref <5)

## 2020-06-04 NOTE — ED Provider Notes (Signed)
MSE was initiated and I personally evaluated the patient and placed orders (if any) at  9:52 PM on June 04, 2020.  The patient appears stable so that the remainder of the MSE may be completed by another provider.  Patient with vaginal pain and itching.  No fevers.  Well-appearing.  Discussed with Dr. Vergie Living and [facility] for further evaluation and treatment.   Benjiman Core, MD 06/04/20 2152

## 2020-06-04 NOTE — ED Triage Notes (Signed)
Pt presents to ED POV. Pt c/o vaginal pain, and itchiness. Pt reports that she is postpartum x2w. Pt states that she has sharp pain and there was difficulty placing foley during hosp stay.

## 2020-06-04 NOTE — MAU Note (Addendum)
Urethral pain and discharge, making it hard to sit pain rating 5-8. Complaints since discharge from hospital two weeks ago. No burning with urination, reports she has been afebrile. Itching in perineum. Reports pressure sometimes alleviates pain.   [facility], RN

## 2020-06-04 NOTE — MAU Provider Note (Addendum)
History     [id number]  Arrival date and time: 06/04/20 2026    Chief Complaint  Patient presents with  . Vaginal Pain   HPI Shelby Perez is a 24 y.o. PP 2 weeks presenting with urethral/clitoral pain.  Patient had an uncomplicated SVD with 2nd degree perineal lac on 05/18/20, patient did have a foley in during delivery. Patient reports that she has had urethral/clitoral pain since discharge and it has worsened. She has tried ibuprofen, dermaplast, tylenol without relief. Pain is not constant, worsens with changes of position and with touch. Associated with vulvar itching. Denies dysuria, inc urinary frequency, suprapubic pain, urinary retention, vaginal/urehtral discharge, abdominal pain, fever, skin changes. Lochia is mild and decreasing.  Past Medical History:  Diagnosis Date  . Amenorrhea   . Anemia   . Anxiety   . Depression     Past Surgical History:  Procedure Laterality Date  . DILATION AND CURETTAGE OF UTERUS    . WISDOM TOOTH EXTRACTION      Family History  Problem Relation Age of Onset  . Breast cancer Paternal Grandmother   . Cervical cancer Paternal Grandmother     Social History   Socioeconomic History  . Marital status: Single    Spouse name: Not on file  . Number of children: Not on file  . Years of education: Not on file  . Highest education level: Not on file  Occupational History  . Not on file  Tobacco Use  . Smoking status: Never Smoker  . Smokeless tobacco: Never Used  Vaping Use  . Vaping Use: Never used  Substance and Sexual Activity  . Alcohol use: Not Currently  . Drug use: Not Currently  . Sexual activity: Yes    Birth control/protection: None  Other Topics Concern  . Not on file  Social History Narrative  . Not on file   Social Determinants of Health   Financial Resource Strain:   . Difficulty of Paying Living Expenses: Not on file  Food Insecurity:   . Worried About Programme researcher, broadcasting/film/video in the Last Year: Not on file   . Ran Out of Food in the Last Year: Not on file  Transportation Needs:   . Lack of Transportation (Medical): Not on file  . Lack of Transportation (Non-Medical): Not on file  Physical Activity:   . Days of Exercise per Week: Not on file  . Minutes of Exercise per Session: Not on file  Stress:   . Feeling of Stress : Not on file  Social Connections:   . Frequency of Communication with Friends and Family: Not on file  . Frequency of Social Gatherings with Friends and Family: Not on file  . Attends Religious Services: Not on file  . Active Member of Clubs or Organizations: Not on file  . Attends Banker Meetings: Not on file  . Marital Status: Not on file  Intimate Partner Violence:   . Fear of Current or Ex-Partner: Not on file  . Emotionally Abused: Not on file  . Physically Abused: Not on file  . Sexually Abused: Not on file    No Known Allergies  No current facility-administered medications on file prior to encounter.   Current Outpatient Medications on File Prior to Encounter  Medication Sig Dispense Refill  . ibuprofen (ADVIL) 600 MG tablet Take 1 tablet (600 mg total) by mouth every 6 (six) hours. 30 tablet 0  . Prenatal Vit-Fe Fumarate-FA (PRENATAL VITAMINS PO) Take by mouth.    Marland Kitchen  triamcinolone (KENALOG) 0.025 % ointment Apply 1 application topically daily. Apply one daily for seven days. (Patient not taking: Reported on 02/27/2020) 30 g 0     ROS Pertinent positives and negative per HPI, all others reviewed and negative  Physical Exam   BP 114/89 (BP Location: Right Arm)   Pulse (!) 108   Temp 98.7 F (37.1 C) (Oral)   Resp 16   SpO2 99%   Physical Exam Constitutional:      General: She is not in acute distress.    Appearance: Normal appearance. She is not ill-appearing, toxic-appearing or diaphoretic.  Eyes:     Extraocular Movements: Extraocular movements intact.  Genitourinary:    General: Normal vulva.     Vagina: Vaginal discharge  (miinimal bloody discharge) present.     Comments: Pain with touch to clitoral/urethral areas. Skin:    General: Skin is warm and dry.     Coloration: Skin is not pale.  Neurological:     General: No focal deficit present.     Mental Status: She is alert. Mental status is at baseline.  Psychiatric:        Mood and Affect: Mood normal.        Behavior: Behavior normal.      Labs Results for orders placed or performed during the hospital encounter of 06/04/20 (from the past 24 hour(s))  Urinalysis, Routine w reflex microscopic Urine, Clean Catch     Status: Abnormal   Collection Time: 06/04/20  9:52 PM  Result Value Ref Range   Color, Urine YELLOW YELLOW   APPearance HAZY (A) CLEAR   Specific Gravity, Urine 1.021 1.005 - 1.030   pH 5.0 5.0 - 8.0   Glucose, UA NEGATIVE NEGATIVE mg/dL   Hgb urine dipstick MODERATE (A) NEGATIVE   Bilirubin Urine NEGATIVE NEGATIVE   Ketones, ur NEGATIVE NEGATIVE mg/dL   Protein, ur NEGATIVE NEGATIVE mg/dL   Nitrite NEGATIVE NEGATIVE   Leukocytes,Ua SMALL (A) NEGATIVE   RBC / HPF 21-50 0 - 5 RBC/hpf   WBC, UA 11-20 0 - 5 WBC/hpf   Bacteria, UA NONE SEEN NONE SEEN   Squamous Epithelial / LPF 0-5 0 - 5   Mucus PRESENT   I-Stat beta hCG blood, ED     Status: None   Collection Time: 06/04/20  9:56 PM  Result Value Ref Range   I-stat hCG, quantitative <5.0 <5 mIU/mL   Comment 3            Imaging No results found.  MAU Course  Procedures  Lab Orders     Wet prep, genital     Culture, Urine     Urinalysis, Routine w reflex microscopic Urine, Clean Catch     I-Stat beta hCG blood, ED No orders of the defined types were placed in this encounter.  Imaging Orders  No imaging studies ordered today    Assessment and Plan  24 yo female PP 2 weeks with   #urethral/clitoral pain: Most likely related to external trauma from delivery + foley associated urethral trauma. UA low evidence of UTI, no UTI symptoms, will not prescribe abx at this  time and send for Ucx. Pelvic exam normal and showing well healing repaired perineal tear. Lower likelihood, but may be due to nerve trauma during delivery. --supportive trx with cold compresses --contact OBGYN if symptoms do not improve in 1 week or for fever/UTI symptoms  #vulvar pruritus --wet prep unremarkable --GC/chl, pending  Glade Lloyd PGY-1  GME  ATTESTATION:  I saw and evaluated the patient. I agree with the findings and the plan of care as documented in the resident's note.  Alric Seton, MD OB Fellow, Faculty [facility], [facility] 06/05/2020 1:51 AM

## 2020-06-04 NOTE — ED Notes (Signed)
Report called to MAU, transport called.  

## 2020-06-05 DIAGNOSIS — L292 Pruritus vulvae: Secondary | ICD-10-CM

## 2020-06-05 DIAGNOSIS — O99893 Other specified diseases and conditions complicating puerperium: Secondary | ICD-10-CM | POA: Diagnosis not present

## 2020-06-05 LAB — WET PREP, GENITAL
Clue Cells Wet Prep HPF POC: NONE SEEN
Sperm: NONE SEEN
Trich, Wet Prep: NONE SEEN
Yeast Wet Prep HPF POC: NONE SEEN

## 2020-06-05 NOTE — Discharge Instructions (Signed)
-continue ibuprofen and tylenol as needed for pain -ice packs and perineal sprays -follow up with obgyn as needed if symptoms continue -we will contact you if urine culture demonstrates urinary tract infection, gonorrhea/chlamydia testing pending, will contact you if positive  Postpartum Care After Vaginal Delivery This sheet gives you information about how to care for yourself from the time you deliver your baby to up to 6-12 weeks after delivery (postpartum period). Your health care provider may also give you more specific instructions. If you have problems or questions, contact your health care provider. Follow these instructions at home: Vaginal bleeding  It is normal to have vaginal bleeding (lochia) after delivery. Wear a sanitary pad for vaginal bleeding and discharge. ? During the first week after delivery, the amount and appearance of lochia is often similar to a menstrual period. ? Over the next few weeks, it will gradually decrease to a dry, yellow-brown discharge. ? For most women, lochia stops completely by 4-6 weeks after delivery. Vaginal bleeding can vary from woman to woman.  Change your sanitary pads frequently. Watch for any changes in your flow, such as: ? A sudden increase in volume. ? A change in color. ? Large blood clots.  If you pass a blood clot from your vagina, save it and call your health care provider to discuss. Do not flush blood clots down the toilet before talking with your health care provider.  Do not use tampons or douches until your health care provider says this is safe.  If you are not breastfeeding, your period should return 6-8 weeks after delivery. If you are feeding your child breast milk only (exclusive breastfeeding), your period may not return until you stop breastfeeding. Perineal care  Keep the area between the vagina and the anus (perineum) clean and dry as told by your health care provider. Use medicated pads and pain-relieving sprays and  creams as directed.  If you had a cut in the perineum (episiotomy) or a tear in the vagina, check the area for signs of infection until you are healed. Check for: ? More redness, swelling, or pain. ? Fluid or blood coming from the cut or tear. ? Warmth. ? Pus or a bad smell.  You may be given a squirt bottle to use instead of wiping to clean the perineum area after you go to the bathroom. As you start healing, you may use the squirt bottle before wiping yourself. Make sure to wipe gently.  To relieve pain caused by an episiotomy, a tear in the vagina, or swollen veins in the anus (hemorrhoids), try taking a warm sitz bath 2-3 times a day. A sitz bath is a warm water bath that is taken while you are sitting down. The water should only come up to your hips and should cover your buttocks. Breast care  Within the first few days after delivery, your breasts may feel heavy, full, and uncomfortable (breast engorgement). Milk may also leak from your breasts. Your health care provider can suggest ways to help relieve the discomfort. Breast engorgement should go away within a few days.  If you are breastfeeding: ? Wear a bra that supports your breasts and fits you well. ? Keep your nipples clean and dry. Apply creams and ointments as told by your health care provider. ? You may need to use breast pads to absorb milk that leaks from your breasts. ? You may have uterine contractions every time you breastfeed for up to several weeks after delivery. Uterine contractions  help your uterus return to its normal size. ? If you have any problems with breastfeeding, work with your health care provider or Advertising copywriter.  If you are not breastfeeding: ? Avoid touching your breasts a lot. Doing this can make your breasts produce more milk. ? Wear a good-fitting bra and use cold packs to help with swelling. ? Do not squeeze out (express) milk. This causes you to make more milk. Intimacy and sexuality  Ask  your health care provider when you can engage in sexual activity. This may depend on: ? Your risk of infection. ? How fast you are healing. ? Your comfort and desire to engage in sexual activity.  You are able to get pregnant after delivery, even if you have not had your period. If desired, talk with your health care provider about methods of birth control (contraception). Medicines  Take over-the-counter and prescription medicines only as told by your health care provider.  If you were prescribed an antibiotic medicine, take it as told by your health care provider. Do not stop taking the antibiotic even if you start to feel better. Activity  Gradually return to your normal activities as told by your health care provider. Ask your health care provider what activities are safe for you.  Rest as much as possible. Try to rest or take a nap while your baby is sleeping. Eating and drinking   Drink enough fluid to keep your urine pale yellow.  Eat high-fiber foods every day. These may help prevent or relieve constipation. High-fiber foods include: ? Whole grain cereals and breads. ? Brown rice. ? Beans. ? Fresh fruits and vegetables.  Do not try to lose weight quickly by cutting back on calories.  Take your prenatal vitamins until your postpartum checkup or until your health care provider tells you it is okay to stop. Lifestyle  Do not use any products that contain nicotine or tobacco, such as cigarettes and e-cigarettes. If you need help quitting, ask your health care provider.  Do not drink alcohol, especially if you are breastfeeding. General instructions  Keep all follow-up visits for you and your baby as told by your health care provider. Most women visit their health care provider for a postpartum checkup within the first 3-6 weeks after delivery. Contact a health care provider if:  You feel unable to cope with the changes that your child brings to your life, and these  feelings do not go away.  You feel unusually sad or worried.  Your breasts become red, painful, or hard.  You have a fever.  You have trouble holding urine or keeping urine from leaking.  You have little or no interest in activities you used to enjoy.  You have not breastfed at all and you have not had a menstrual period for 12 weeks after delivery.  You have stopped breastfeeding and you have not had a menstrual period for 12 weeks after you stopped breastfeeding.  You have questions about caring for yourself or your baby.  You pass a blood clot from your vagina. Get help right away if:  You have chest pain.  You have difficulty breathing.  You have sudden, severe leg pain.  You have severe pain or cramping in your lower abdomen.  You bleed from your vagina so much that you fill more than one sanitary pad in one hour. Bleeding should not be heavier than your heaviest period.  You develop a severe headache.  You faint.  You have blurred vision  or spots in your vision.  You have bad-smelling vaginal discharge.  You have thoughts about hurting yourself or your baby. If you ever feel like you may hurt yourself or others, or have thoughts about taking your own life, get help right away. You can go to the nearest emergency department or call:  Your local emergency services (911 in the [place]  A suicide crisis helpline, such as the National Suicide Prevention Lifeline at 407-092-4116. This is open 24 hours a day. Summary  The period of time right after you deliver your newborn up to 6-12 weeks after delivery is called the postpartum period.  Gradually return to your normal activities as told by your health care provider.  Keep all follow-up visits for you and your baby as told by your health care provider. This information is not intended to replace advice given to you by your health care provider. Make sure you discuss any questions you have with your health care  provider. Document Revised: 08/20/2017 Document Reviewed: 05/31/2017 Elsevier Patient Education  2020 [employer].

## 2020-06-06 LAB — GC/CHLAMYDIA PROBE AMP (~~LOC~~) NOT AT ARMC
Chlamydia: NEGATIVE
Comment: NEGATIVE
Comment: NORMAL
Neisseria Gonorrhea: NEGATIVE

## 2020-08-02 IMAGING — US US OB < 14 WEEKS - US OB TV
1 series · 15 of 28 positions shown · non-contrast
Comparison: None.

CLINICAL DATA: Vaginal bleeding and nausea and vomiting 1st
trimester pregnancy.

EXAM:
OBSTETRIC <14 WK US AND TRANSVAGINAL OB US
TECHNIQUE: Both transabdominal and transvaginal ultrasound examinations were
performed for complete evaluation of the gestation as well as the
maternal uterus, adnexal regions, and pelvic cul-de-sac.
Transvaginal technique was performed to assess early pregnancy.

[Series 1: us ob < 14 weeks - us ob tv · 73 acquisitions, 15 frames shown]
[im 1/73]
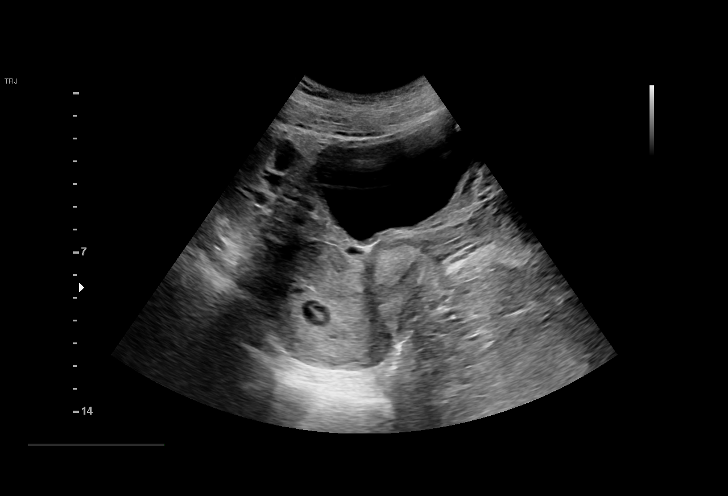
[im 6/73]
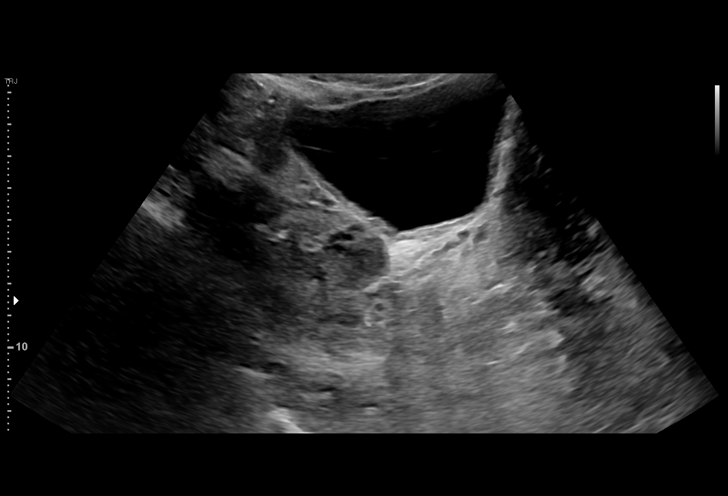
[im 11/73]
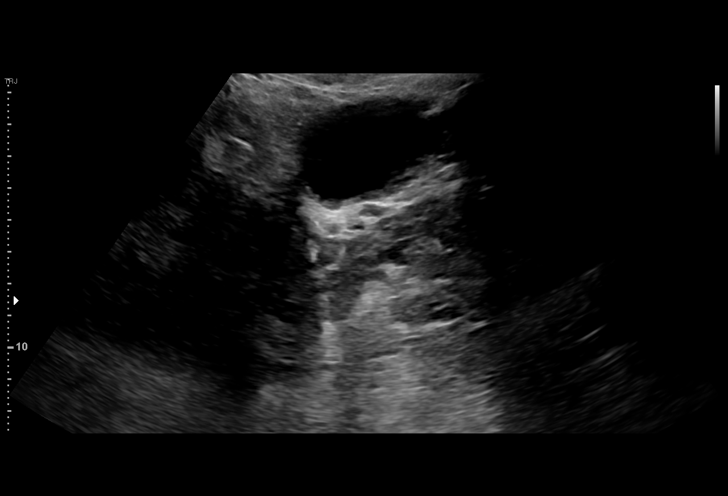
[im 17/73]
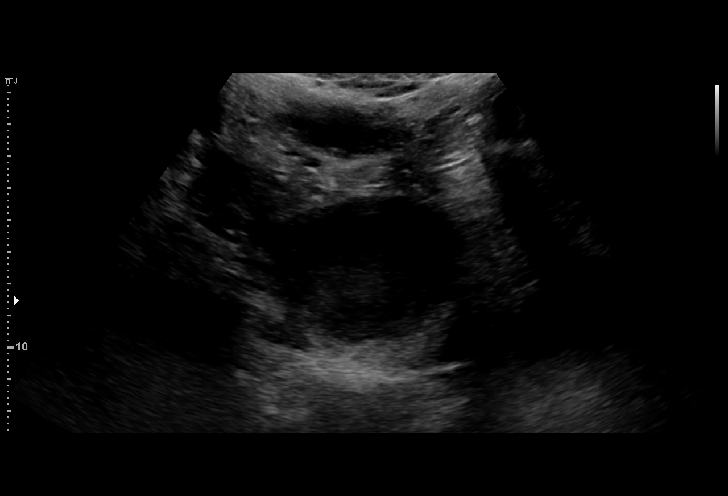
[im 22/73]
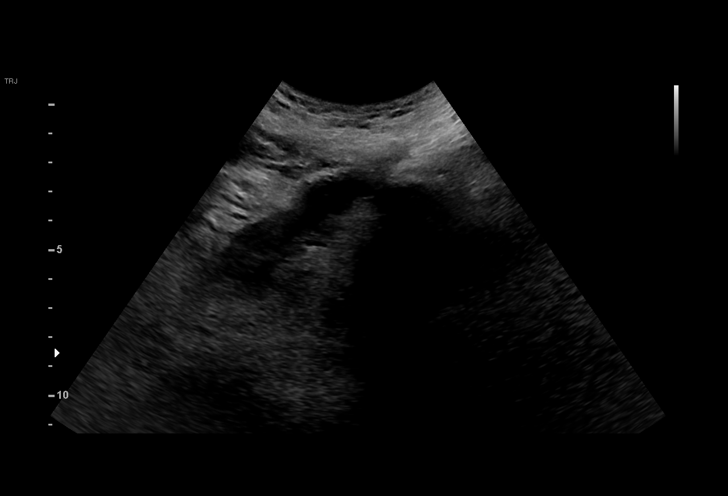
[im 27/73]
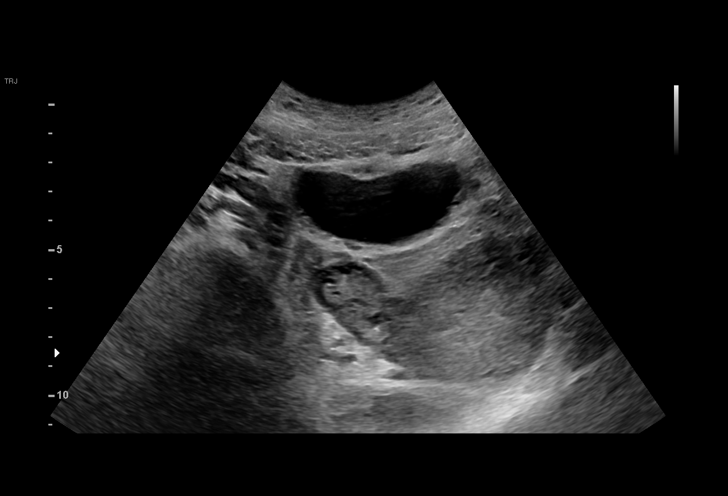
[im 33/73]
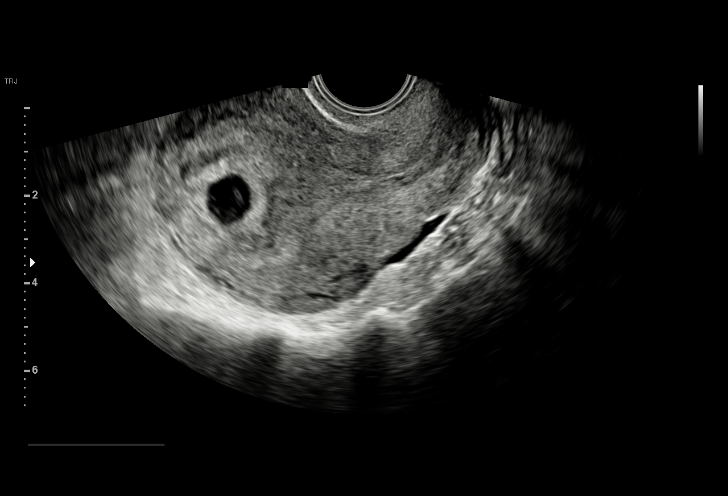
[im 38/73]
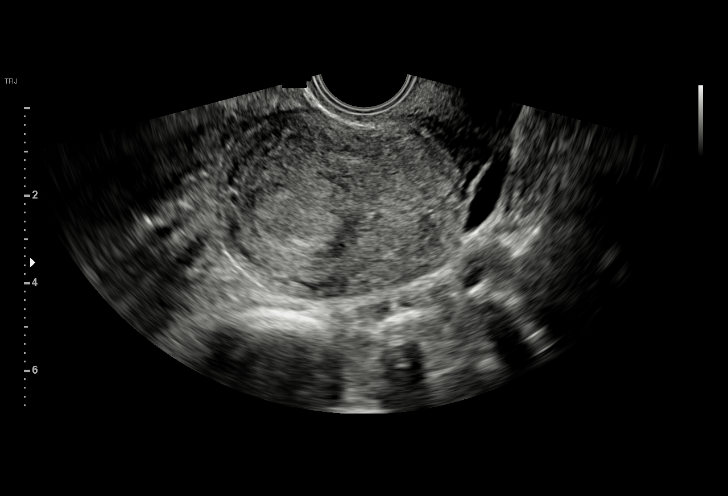
[im 41/73]
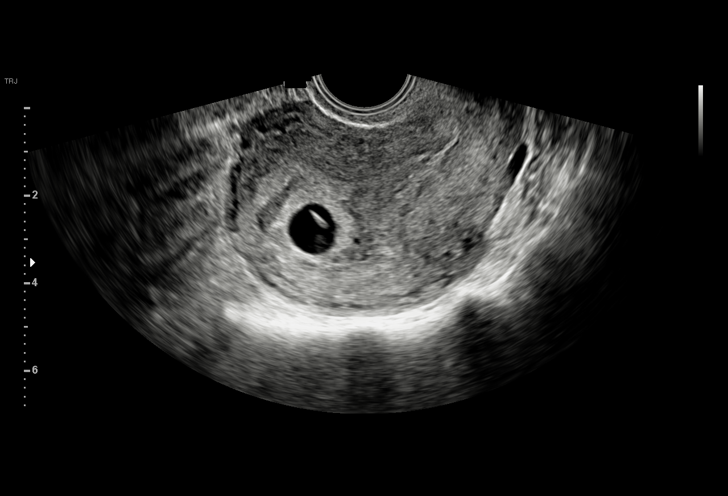
[im 46/73]
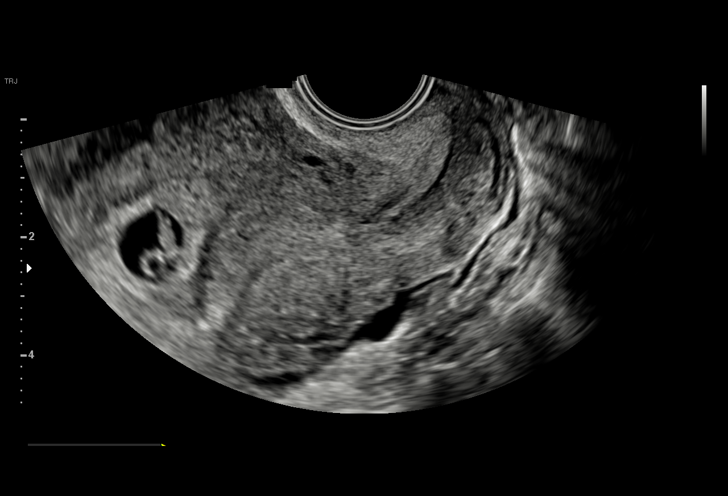
[im 51/73]
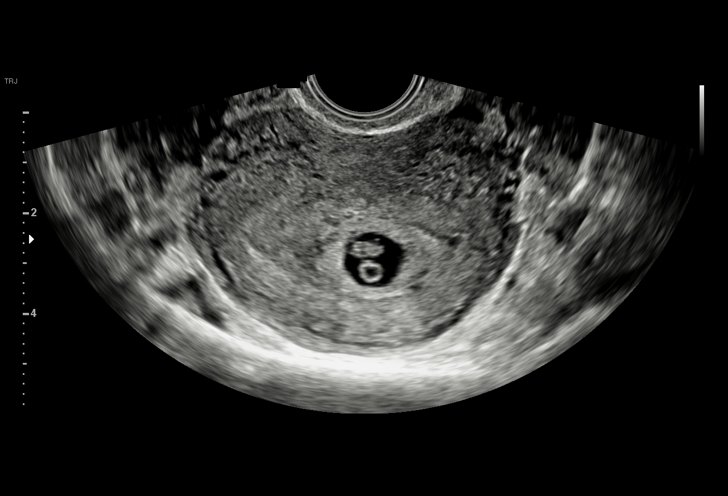
[im 57/73]
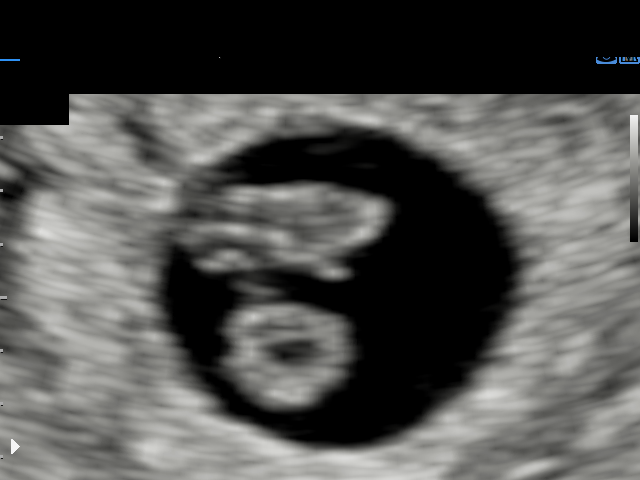
[im 62/73]
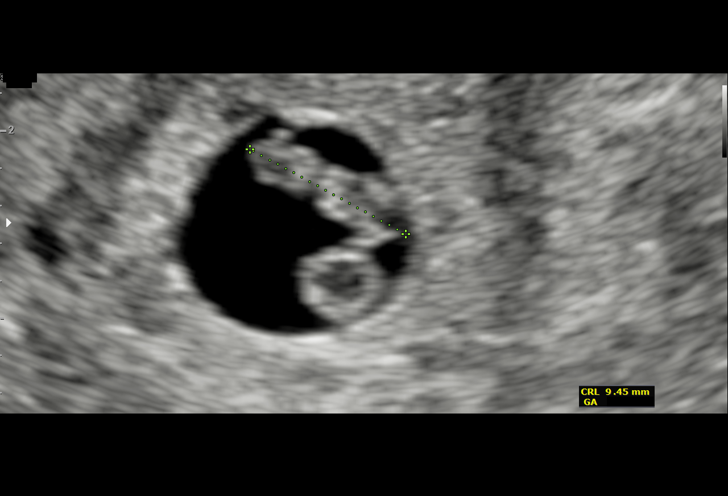
[im 67/73]
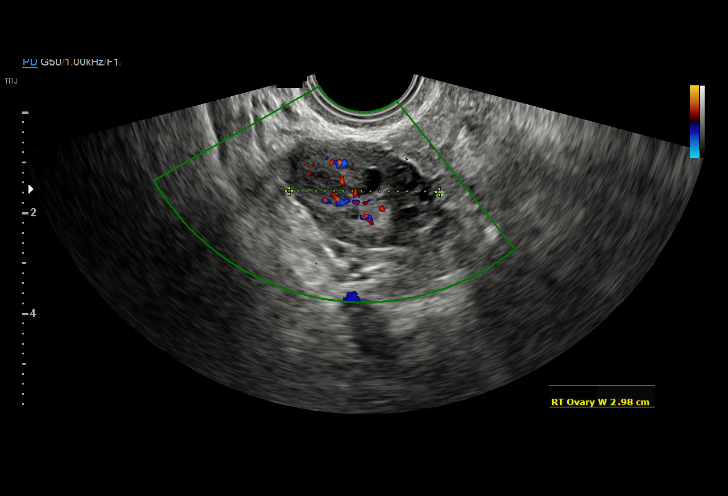
[im 73/73]
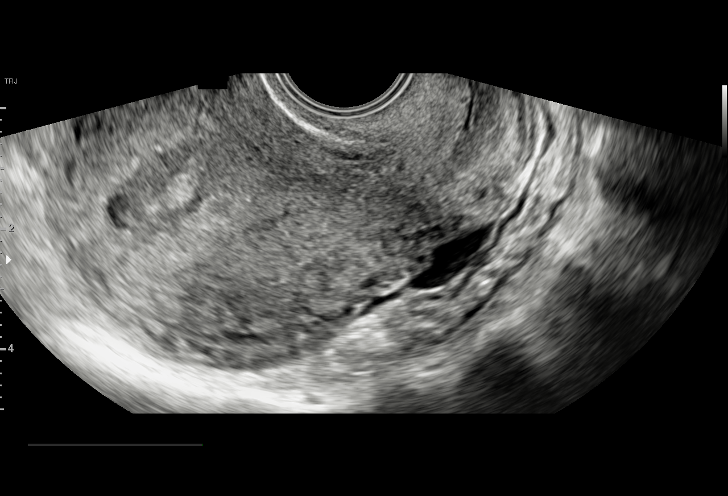

[15 of 28 positions shown; findings below may reference images not displayed]

FINDINGS: Intrauterine gestational sac: Single

Yolk sac:  Visualized.

Embryo:  Visualized.

Cardiac Activity: Visualized.

Heart Rate: 121 bpm

CRL:  9 mm   6 w   6 d                  US EDC: 05/10/2020

Subchorionic hemorrhage:  None visualized.

Maternal uterus/adnexae: Small right ovarian corpus luteum noted.
Normal appearance of left ovary. A 1.7 cm benign appearing left
paraovarian cyst is incidentally noted. No suspicious adnexal mass
or abnormal free fluid identified.
IMPRESSION: Single living IUP with estimated gestational age of 6 weeks 6 days,
and US EDC of 05/10/2020.

No maternal uterine or adnexal abnormality identified.

## 2021-01-02 ENCOUNTER — Ambulatory Visit (HOSPITAL_COMMUNITY): Payer: Self-pay

## 2021-01-02 ENCOUNTER — Other Ambulatory Visit: Payer: Self-pay

## 2021-01-02 ENCOUNTER — Ambulatory Visit
Admission: RE | Admit: 2021-01-02 | Discharge: 2021-01-02 | Disposition: A | Discharge status: Home / Self Care | Payer: BC Managed Care – PPO | Source: Ambulatory Visit | Attending: Emergency Medicine | Admitting: Emergency Medicine

## 2021-01-02 VITALS — BP 106/71 | HR 86 | Temp 97.8°F | Resp 16

## 2021-01-02 DIAGNOSIS — J029 Acute pharyngitis, unspecified: Secondary | ICD-10-CM

## 2021-01-02 DIAGNOSIS — R0981 Nasal congestion: Secondary | ICD-10-CM

## 2021-01-02 LAB — POCT RAPID STREP A (OFFICE): Rapid Strep A Screen: NEGATIVE

## 2021-01-02 MED ORDER — CETIRIZINE HCL 10 MG PO TABS: 10.0000 mg | ORAL_TABLET | Freq: Every day | ORAL | 0 refills | Status: AC

## 2021-01-02 MED ORDER — FLUTICASONE PROPIONATE 50 MCG/ACT NA SUSP: 2.0000 | Freq: Every day | NASAL | 0 refills | Status: AC

## 2021-01-02 MED ORDER — ACETAMINOPHEN 500 MG PO TABS: 500.0000 mg | ORAL_TABLET | Freq: Four times a day (QID) | ORAL | 0 refills | Status: AC | PRN

## 2021-01-02 NOTE — ED Triage Notes (Signed)
Pt presents with c/o sore throat and nasal congestion and sore throat  That began on [place]

## 2021-01-02 NOTE — ED Provider Notes (Signed)
[facility]   [id number] 01/02/21 Arrival Time: 1030   CC: Sore throat  SUBJECTIVE: History from: patient.  Shelby Perez is a 25 y.o. female who presents with sore throat, and nasal congestion x 4 days.  Denies sick exposure to COVID, flu or strep.  Has tried OTC medications without relief.  Symptoms are made worse at night.  Reports previous symptoms in the past.   Denies fever, chills, SOB, wheezing, chest pain, nausea, changes in bowel or bladder habits.    Breast feeding   ROS: As per HPI.  All other pertinent ROS negative.     Past Medical History:  Diagnosis Date  . Amenorrhea   . Anemia   . Anxiety   . Depression    Past Surgical History:  Procedure Laterality Date  . DILATION AND CURETTAGE OF UTERUS    . WISDOM TOOTH EXTRACTION     No Known Allergies No current facility-administered medications on file prior to encounter.   Current Outpatient Medications on File Prior to Encounter  Medication Sig Dispense Refill  . Prenatal Vit-Fe Fumarate-FA (PRENATAL VITAMINS PO) Take by mouth.     Social History   Socioeconomic History  . Marital status: Single    Spouse name: Not on file  . Number of children: Not on file  . Years of education: Not on file  . Highest education level: Not on file  Occupational History  . Not on file  Tobacco Use  . Smoking status: Never Smoker  . Smokeless tobacco: Never Used  Vaping Use  . Vaping Use: Never used  Substance and Sexual Activity  . Alcohol use: Not Currently  . Drug use: Not Currently  . Sexual activity: Yes    Birth control/protection: None  Other Topics Concern  . Not on file  Social History Narrative  . Not on file   Social Determinants of Health   Financial Resource Strain: Not on file  Food Insecurity: Not on file  Transportation Needs: Not on file  Physical Activity: Not on file  Stress: Not on file  Social Connections: Not on file  Intimate Partner Violence: Not on file   Family  History  Problem Relation Age of Onset  . Breast cancer Paternal Grandmother   . Cervical cancer Paternal Grandmother     OBJECTIVE:  Vitals:   01/02/21 1037  BP: 106/71  Pulse: 86  Resp: 16  Temp: 97.8 F (36.6 C)  TempSrc: Tympanic  SpO2: 96%     General appearance: alert; appears fatigued, but nontoxic; speaking in full sentences and tolerating own secretions HEENT: NCAT; Ears: EACs clear, TMs pearly gray; Eyes: PERRL.  EOM grossly intact. Nose: nares patent without rhinorrhea, Throat: oropharynx clear, tonsils non erythematous or enlarged, uvula midline  Neck: supple without LAD Lungs: unlabored respirations, symmetrical air entry; cough: absent; no respiratory distress; CTAB Heart: regular rate and rhythm.  Skin: warm and dry Psychological: alert and cooperative; normal mood and affect  LABS:  Results for orders placed or performed during the hospital encounter of 01/02/21 (from the past 24 hour(s))  POCT rapid strep A     Status: None   Collection Time: 01/02/21 11:22 AM  Result Value Ref Range   Rapid Strep A Screen Negative Negative     ASSESSMENT & PLAN:  1. Sore throat   2. Nasal congestion     Meds ordered this encounter  Medications  . cetirizine (ZYRTEC) 10 MG tablet    Sig: Take 1 tablet (10  mg total) by mouth daily.    Dispense:  30 tablet    Refill:  0    Order Specific Question:   Supervising Provider    Answer:   Eustace Moore [[id number]]  . fluticasone (FLONASE) 50 MCG/ACT nasal spray    Sig: Place 2 sprays into both nostrils daily.    Dispense:  16 g    Refill:  0    Order Specific Question:   Supervising Provider    Answer:   Eustace Moore [[id number]]  . acetaminophen (TYLENOL) 500 MG tablet    Sig: Take 1 tablet (500 mg total) by mouth every 6 (six) hours as needed.    Dispense:  30 tablet    Refill:  0    Order Specific Question:   Supervising Provider    Answer:   Eustace Moore [[id number]]    Strep negative.  Culture  sent COVID testing ordered.  It will take between 5-7 days for test results.  Someone will contact you regarding abnormal results.   In the meantime: You should remain isolated in your home for 10 days from symptom onset AND greater than 72 hours after symptoms resolution (absence of fever without the use of fever-reducing medication and improvement in respiratory symptoms), whichever is longer Get plenty of rest and push fluids Tylenol for pain Zyrtec for nasal congestion, runny nose, and/or sore throat Flonase for nasal congestion and runny nose Use medications daily for symptom relief Call or go to the ED if you have any new or worsening symptoms such as fever, worsening cough, shortness of breath, chest tightness, chest pain, turning blue, changes in mental status, etc...    Reviewed expectations re: course of current medical issues. Questions answered. Outlined signs and symptoms indicating need for more acute intervention. Patient verbalized understanding. After Visit Summary given.         Rennis Harding, PA-C 01/02/21 1135

## 2021-01-02 NOTE — Discharge Instructions (Addendum)
Strep negative.  Culture sent COVID testing ordered.  It will take between 5-7 days for test results.  Someone will contact you regarding abnormal results.   In the meantime: You should remain isolated in your home for 10 days from symptom onset AND greater than 72 hours after symptoms resolution (absence of fever without the use of fever-reducing medication and improvement in respiratory symptoms), whichever is longer Get plenty of rest and push fluids Tylenol for pain Zyrtec for nasal congestion, runny nose, and/or sore throat Flonase for nasal congestion and runny nose Use medications daily for symptom relief Call or go to the ED if you have any new or worsening symptoms such as fever, worsening cough, shortness of breath, chest tightness, chest pain, turning blue, changes in mental status, etc..Marland Kitchen

## 2021-01-03 LAB — COVID-19, FLU A+B NAA
Influenza A, NAA: NOT DETECTED
Influenza B, NAA: NOT DETECTED
SARS-CoV-2, NAA: NOT DETECTED

## 2021-01-05 LAB — CULTURE, GROUP A STREP (THRC)

## 2021-01-28 DIAGNOSIS — J3089 Other allergic rhinitis: Secondary | ICD-10-CM | POA: Insufficient documentation

## 2021-05-01 ENCOUNTER — Ambulatory Visit (INDEPENDENT_AMBULATORY_CARE_PROVIDER_SITE_OTHER): Payer: BC Managed Care – PPO | Admitting: Obstetrics and Gynecology

## 2021-05-01 ENCOUNTER — Encounter: Payer: Self-pay | Admitting: Obstetrics and Gynecology

## 2021-05-01 ENCOUNTER — Other Ambulatory Visit (HOSPITAL_COMMUNITY)
Admission: RE | Admit: 2021-05-01 | Discharge: 2021-05-01 | Disposition: A | Discharge status: Home / Self Care | Payer: BC Managed Care – PPO | Source: Ambulatory Visit | Attending: Obstetrics and Gynecology | Admitting: Obstetrics and Gynecology

## 2021-05-01 ENCOUNTER — Other Ambulatory Visit: Payer: Self-pay

## 2021-05-01 VITALS — BP 116/70 | HR 88 | Resp 16 | Ht 64.75 in | Wt 150.0 lb

## 2021-05-01 DIAGNOSIS — Z3009 Encounter for other general counseling and advice on contraception: Secondary | ICD-10-CM | POA: Diagnosis not present

## 2021-05-01 DIAGNOSIS — Z124 Encounter for screening for malignant neoplasm of cervix: Secondary | ICD-10-CM | POA: Insufficient documentation

## 2021-05-01 DIAGNOSIS — Z01419 Encounter for gynecological examination (general) (routine) without abnormal findings: Secondary | ICD-10-CM | POA: Diagnosis not present

## 2021-05-01 MED ORDER — DROSPIRENONE-ETHINYL ESTRADIOL 3-0.02 MG PO TABS: 1.0000 | ORAL_TABLET | Freq: Every day | ORAL | 0 refills | Status: DC

## 2021-05-01 NOTE — Progress Notes (Signed)
25 y.o. [id number] Single White or Caucasian Not Hispanic or Latino female here for annual exam.  Baby is almost 1, nursed until 8 months. Cycles have just been started in the last few months, not regular yet. Same partner x 4 years, bought a house together.  Period Duration (Days): 4-5 Period Pattern: (!) Irregular Menstrual Flow:  (heavy then lightens up) Menstrual Control: Maxi pad Dysmenorrhea:  (mild to moderate)  She had a medication abortion a month ago. Bleeding started on 04/03/21, bleed for 2 weeks. Last few days she has had cramping  Plans to use condoms.   She has hormonal acne. Moody for up to 2 weeks prior to her cycle.  Last tried OCP's when she was 16.   Patient's last menstrual period was 04/03/2021 (exact date).          Sexually active: Yes.    The current method of family planning is condoms all the time.    Exercising: Yes.     Walking, gym Smoker:  no  Health Maintenance: Pap:  unsure if done with OB/GYN History of abnormal Pap:  no MMG:  none BMD:   none Colonoscopy: none TDaP:  UTD per patient Gardasil: not done   reports that she has never smoked. She has never used smokeless tobacco. She reports current alcohol use. She reports current drug use. Son is almost one. 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   Past Medical History:  Diagnosis Date   Amenorrhea    Anemia    Anxiety    Depression     Past Surgical History:  Procedure Laterality Date   DILATION AND CURETTAGE OF UTERUS     WISDOM TOOTH EXTRACTION      Current Outpatient Medications  Medication Sig Dispense Refill   acetaminophen (TYLENOL) 500 MG tablet Take 1 tablet (500 mg total) by mouth every 6 (six) hours as needed. 30 tablet 0   cetirizine (ZYRTEC) 10 MG tablet Take 1 tablet (10 mg total) by mouth daily. 30 tablet 0   fluticasone (FLONASE) 50 MCG/ACT nasal spray Place 2 sprays into both nostrils daily. 16 g 0   IBUPROFEN PO Take by mouth as needed.     No  current facility-administered medications for this visit.    Family History  Problem Relation Age of Onset   Breast cancer Paternal Grandmother    Cervical cancer Paternal Grandmother     Review of Systems  Constitutional: Negative.   HENT: Negative.    Eyes: Negative.   Respiratory: Negative.    Cardiovascular: Negative.   Gastrointestinal: Negative.   Endocrine: Negative.   Genitourinary: Negative.   Musculoskeletal: Negative.   Skin: Negative.   Allergic/Immunologic: Negative.   Neurological: Negative.   Hematological: Negative.   Psychiatric/Behavioral: Negative.     Exam:   BP 116/70   Pulse 88   Resp 16   Ht 5' 4.75" (1.645 m)   Wt 150 lb (68 kg)   LMP 04/03/2021 (Exact Date)   BMI 25.15 kg/m   Weight change: @WEIGHTCHANGE @ Height:   Height: 5' 4.75" (164.5 cm)  Ht Readings from Last 3 Encounters:  05/01/21 5' 4.75" (1.645 m)  05/18/20 5\' 5"  (1.651 m)  12/14/19 5\' 5"  (1.651 m)    General appearance: alert, cooperative and appears stated age Head: Normocephalic, without obvious abnormality, atraumatic Neck: no adenopathy, supple, symmetrical, trachea midline and thyroid normal to inspection and palpation Lungs: clear to auscultation bilaterally Cardiovascular: regular rate and rhythm  Breasts: normal appearance, no masses or tenderness Abdomen: soft, non-tender; non distended,  no masses,  no organomegaly Extremities: extremities normal, atraumatic, no cyanosis or edema Skin: Skin color, texture, turgor normal. No rashes or lesions Lymph nodes: Cervical, supraclavicular, and axillary nodes normal. No abnormal inguinal nodes palpated Neurologic: Grossly normal   Pelvic: External genitalia:  no lesions              Urethra:  normal appearing urethra with no masses, tenderness or lesions              Bartholins and Skenes: normal                 Vagina: normal appearing vagina with normal color and discharge, no lesions              Cervix: no lesions                Bimanual Exam:  Uterus:  normal size, contour, position, consistency, mobility, non-tender              Adnexa: no mass, fullness, tenderness               Rectovaginal: Confirms               Anus:  normal sphincter tone, no lesions  Cornelia Copa, CMA chaperoned for the exam.  1. Well woman exam Discussed breast self exam Discussed calcium and vit D intake Labs with primary Declines STD testing  2. Screening for cervical cancer - Cytology - PAP  3. General counseling and advice on female contraception She has acne and PMS - drospirenone-ethinyl estradiol (YAZ) 3-0.02 MG tablet; Take 1 tablet by mouth daily.  Dispense: 84 tablet; Refill: 0

## 2021-05-01 NOTE — Patient Instructions (Addendum)
Counselor: Roosevelt Locks [phone]  Oral Contraception Information Oral contraceptive pills (OCPs) are medicines taken by mouth to prevent pregnancy. They work by: Preventing the ovaries from releasing eggs. Thickening mucus in the lower part of the uterus (cervix). This prevents sperm from entering the uterus. Thinning the lining of the uterus (endometrium). This prevents a fertilized egg from attaching to the endometrium. OCPs are highly effective when taken exactly as prescribed. However, OCPs do not prevent STIs (sexually transmitted infections). Using condoms while on an OCP can help prevent STIs. What happens before starting OCPs? Before you start taking OCPs: You may have a physical exam, blood test, and Pap test. Your health care provider will make sure you are a good candidate for oral contraception. OCPs are not a good option for certain women, such as: Women who smoke and are older than age 46. Women who have or have had certain conditions, such as: A history of high blood pressure. Deep vein thrombosis. Pulmonary embolism. Stroke. Cardiovascular disease. Peripheral vascular disease. Ask your health care provider about the possible side effects of the OCP you may be prescribed. Be aware that it can take 2-3 months for your body to adjust to changes in hormone levels. Types of oral contraception Birth control pills contain the hormones estrogen and progestin (synthetic progesterone) or progestin only. The combination pill This type of pill contains estrogen and progestin hormones. Conventional contraception pills come in packs of 21 or 28 pills. Some packs with 28-day pills contain estrogen and progestin for the first 21-24 days. Hormone-free tablets, called placebos, are taken for the final 4-7 days. You should have menstrual bleeding during the time you take the placebos. In packs with 21 tablets, you take no pills for 7 days. Menstrual bleeding occurs during these days.  (Some people prefer taking a pill for 28 days to help establish a routine). Extended-interval contraception pills come in packs of 91 pills. The first 84 tablets have both estrogen and progestin. The last 7 pills are placebos. Menstrual bleeding occurs during the placebo days. With this schedule, menstrual bleeding happens once every 3 months. Continuous contraception pills come in packs of 28 pills. All pills in the pack contain estrogen and progestin. With this schedule, regular menstrual bleeding does not happen, but there may be spotting or irregular bleeding. Progestin-only pills This type of pill is often called the mini-pill and contains the progestin hormone only. It comes in packs of 28 pills. In some packs, the last 4 pills are placebos. The pill must be taken at the same time every day. This is very important to prevent pregnancy. Menstrual bleeding may not be regular or predictable. What are the advantages? Oral contraception provides reliable and continuous contraception if taken as directed. It may treat or decrease symptoms of: Menstrual period cramps. Irregular menstrual cycle or bleeding. Heavy menstrual flow. Abnormal uterine bleeding. Acne, depending on the type of pill. Polycystic ovarian syndrome (POS). Endometriosis. Iron deficiency anemia. Premenstrual symptoms, including severe irritability, depression, or anxiety. It also may: Reduce the risk of endometrial and ovarian cancer. Be used as emergency contraception. Prevent ectopic pregnancies and infections of the fallopian tubes. What can make OCPs less effective? OCPs may be less effective if: You forget to take the pill every day. For progestin-only pills, it is especially important to take the pill at the same time each day. Even taking it 3 hours late can increase the risk of pregnancy. You have a stomach or intestinal disease that reduces your body's ability  to absorb the pill. You take OCPs with other medicines  that make OCPs less effective, such as antibiotics, certain HIV medicines, and some seizure medicines. You take expired OCPs. You forget to restart the pill after 7 days of not taking it. This refers to the packs of 21 pills. What are the side effects and risks? OCPs can sometimes cause side effects, such as: Headache. Depression. Trouble sleeping. Nausea and vomiting. Breast tenderness. Irregular bleeding or spotting during the first several months. Bloating or fluid retention. Increase in blood pressure. Combination pills may slightly increase the risk of: Blood clots. Heart attack. Stroke. Follow these instructions at home: Follow instructions from your health care provider about how to start taking your first cycle of OCPs. Depending on when you start the pill, you may need to use a backup form of birth control, such as condoms, during the first week. Make sure you know what steps to take if you forget to take the pill. Summary Oral contraceptive pills (OCPs) are medicines taken by mouth to prevent pregnancy. They are highly effective when taken exactly as prescribed. OCPs contain a combination of the hormones estrogen and progestin (synthetic progesterone) or progestin only. Before you start taking the pill, you may have a physical exam, blood test, and Pap test. Your health care provider will make sure you are a good candidate for oral contraception. The combination pill may come in a 21-day pack, a 28-day pack, or a 91-day pack. Progestin-only pills come in packs of 28 pills. OCPs can sometimes cause side effects, such as headache, nausea, breast tenderness, or irregular bleeding. This information is not intended to replace advice given to you by your health care provider. Make sure you discuss any questions you have with your health care provider. Document Revised: 05/17/2020 Document Reviewed: 04/25/2020 Elsevier Patient Education  2022 Elsevier Inc.   EXERCISE   We  recommended that you start or continue a regular exercise program for good health. Physical activity is anything that gets your body moving, some is better than none. The CDC recommends 150 minutes per week of Moderate-Intensity Aerobic Activity and 2 or more days of Muscle Strengthening Activity.  Benefits of exercise are limitless: helps weight loss/weight maintenance, improves mood and energy, helps with depression and anxiety, improves sleep, tones and strengthens muscles, improves balance, improves bone density, protects from chronic conditions such as heart disease, high blood pressure and diabetes and so much more. To learn more visit: http://kirby-bean.org/  DIET: Good nutrition starts with a healthy diet of fruits, vegetables, whole grains, and lean protein sources. Drink plenty of water for hydration. Minimize empty calories, sodium, sweets. For more information about dietary recommendations visit: CriticalGas.be and https://www.carpenter-henry.info/  ALCOHOL:  Women should limit their alcohol intake to no more than 7 drinks/beers/glasses of wine (combined, not each!) per week. Moderation of alcohol intake to this level decreases your risk of breast cancer and liver damage.  If you are concerned that you may have a problem, or your friends have told you they are concerned about your drinking, there are many resources to help. A well-known program that is free, effective, and available to all people all over the nation is Alcoholics Anonymous.  Check out this site to learn more: BeverageBargains.co.za   CALCIUM AND VITAMIN D:  Adequate intake of calcium and Vitamin D are recommended for bone health.  You should be getting between 1000-1200 mg of calcium and 800 units of Vitamin D daily between diet and supplements  PAP SMEARS:  Pap smears, to check for cervical cancer or precancers,  have traditionally been done yearly,  scientific advances have shown that most women can have pap smears less often.  However, every woman still should have a physical exam from her gynecologist every year. It will include a breast check, inspection of the vulva and vagina to check for abnormal growths or skin changes, a visual exam of the cervix, and then an exam to evaluate the size and shape of the uterus and ovaries. We will also provide age appropriate advice regarding health maintenance, like when you should have certain vaccines, screening for sexually transmitted diseases, bone density testing, colonoscopy, mammograms, etc.   MAMMOGRAMS:  All women over 64 years old should have a routine mammogram.   COLON CANCER SCREENING: Now recommend starting at age 79. At this time colonoscopy is not covered for routine screening until 50. There are take home tests that can be done between 45-49.   COLONOSCOPY:  Colonoscopy to screen for colon cancer is recommended for all women at age 69.  We know, you hate the idea of the prep.  We agree, BUT, having colon cancer and not knowing it is worse!!  Colon cancer so often starts as a polyp that can be seen and removed at colonscopy, which can quite literally save your life!  And if your first colonoscopy is normal and you have no family history of colon cancer, most women don't have to have it again for 10 years.  Once every ten years, you can do something that may end up saving your life, right?  We will be happy to help you get it scheduled when you are ready.  Be sure to check your insurance coverage so you understand how much it will cost.  It may be covered as a preventative service at no cost, but you should check your particular policy.      Breast Self-Awareness Breast self-awareness means being familiar with how your breasts look and feel. It involves checking your breasts regularly and reporting any changes to your health care provider. Practicing breast self-awareness is important. A  change in your breasts can be a sign of a serious medical problem. Being familiar with how your breasts look and feel allows you to find any problems early, when treatment is more likely to be successful. All women should practice breast self-awareness, including women who have had breast implants. How to do a breast self-exam One way to learn what is normal for your breasts and whether your breasts are changing is to do a breast self-exam. To do a breast self-exam: Look for Changes  Remove all the clothing above your waist. Stand in front of a mirror in a room with good lighting. Put your hands on your hips. Push your hands firmly downward. Compare your breasts in the mirror. Look for differences between them (asymmetry), such as: Differences in shape. Differences in size. Puckers, dips, and bumps in one breast and not the other. Look at each breast for changes in your skin, such as: Redness. Scaly areas. Look for changes in your nipples, such as: Discharge. Bleeding. Dimpling. Redness. A change in position. Feel for Changes Carefully feel your breasts for lumps and changes. It is best to do this while lying on your back on the floor and again while sitting or standing in the shower or tub with soapy water on your skin. Feel each breast in the following way: Place the arm on the side of the breast you  are examining above your head. Feel your breast with the other hand. Start in the nipple area and make  inch (2 cm) overlapping circles to feel your breast. Use the pads of your three middle fingers to do this. Apply light pressure, then medium pressure, then firm pressure. The light pressure will allow you to feel the tissue closest to the skin. The medium pressure will allow you to feel the tissue that is a little deeper. The firm pressure will allow you to feel the tissue close to the ribs. Continue the overlapping circles, moving downward over the breast until you feel your ribs below  your breast. Move one finger-width toward the center of the body. Continue to use the  inch (2 cm) overlapping circles to feel your breast as you move slowly up toward your collarbone. Continue the up and down exam using all three pressures until you reach your armpit.  Write Down What You Find  Write down what is normal for each breast and any changes that you find. Keep a written record with breast changes or normal findings for each breast. By writing this information down, you do not need to depend only on memory for size, tenderness, or location. Write down where you are in your menstrual cycle, if you are still menstruating. If you are having trouble noticing differences in your breasts, do not get discouraged. With time you will become more familiar with the variations in your breasts and more comfortable with the exam. How often should I examine my breasts? Examine your breasts every month. If you are breastfeeding, the best time to examine your breasts is after a feeding or after using a breast pump. If you menstruate, the best time to examine your breasts is 5-7 days after your period is over. During your period, your breasts are lumpier, and it may be more difficult to notice changes. When should I see my health care provider? See your health care provider if you notice: A change in shape or size of your breasts or nipples. A change in the skin of your breast or nipples, such as a reddened or scaly area. Unusual discharge from your nipples. A lump or thick area that was not there before. Pain in your breasts. Anything that concerns you.

## 2021-05-02 LAB — CYTOLOGY - PAP: Diagnosis: NEGATIVE

## 2021-07-09 ENCOUNTER — Encounter: Payer: Self-pay | Admitting: Obstetrics and Gynecology

## 2021-07-09 ENCOUNTER — Other Ambulatory Visit: Payer: Self-pay

## 2021-07-09 ENCOUNTER — Ambulatory Visit (INDEPENDENT_AMBULATORY_CARE_PROVIDER_SITE_OTHER): Payer: BC Managed Care – PPO | Admitting: Obstetrics and Gynecology

## 2021-07-09 VITALS — BP 114/68 | HR 87 | Ht 64.75 in | Wt 141.6 lb

## 2021-07-09 DIAGNOSIS — L708 Other acne: Secondary | ICD-10-CM

## 2021-07-09 DIAGNOSIS — F3281 Premenstrual dysphoric disorder: Secondary | ICD-10-CM | POA: Diagnosis not present

## 2021-07-09 DIAGNOSIS — Z3009 Encounter for other general counseling and advice on contraception: Secondary | ICD-10-CM | POA: Diagnosis not present

## 2021-07-09 MED ORDER — ELLA 30 MG PO TABS
1.0000 | ORAL_TABLET | Freq: Once | ORAL | 0 refills | Status: AC
Start: 2021-07-09 — End: 2021-07-09

## 2021-07-09 NOTE — Patient Instructions (Signed)
Premenstrual Syndrome Premenstrual syndrome (PMS) is a group of physical, emotional, and behavioral symptoms that affect women as part of their menstrual cycle. PMS occurs 1-2 weeks before the start of a woman's menstrual period and goes away a few daysafter menstrual bleeding begins. PMS can range from mild to severe. What are the causes? The exact cause of this condition is not known, but it seems to be related tohormone changes that happen before menstruation. What are the signs or symptoms? Symptoms of this condition often happen every month. They go away after your period starts. Physical symptoms of this condition include: Bloating. Breast pain or tenderness. Headaches. Extreme fatigue. Backaches. Swelling of the hands and feet. Weight gain. Hot flashes. Emotional symptoms of this condition include: Mood swings. Depression. Angry or hostile outbursts. Irritability. Anxiety. Crying spells. Behavioral symptoms include: Food cravings or appetite changes. Changes in sexual desire. Confusion. Social withdrawal. Poor concentration. How is this diagnosed? This condition may be diagnosed based on a history of your symptoms. This condition is generally diagnosed if symptoms of PMS: Are present in the 5 days before your period starts. End within 4 days after your period starts. Happen at least 3 months in a row. Interfere with some of your normal activities. Other conditions that can cause some of these symptoms must be ruled out before PMS can be diagnosed. These include depression, anxiety, anemia, and thyroidproblems. How is this treated? This condition may be treated by doing the following: Maintaining a healthy lifestyle. This includes eating a well-balanced diet and exercising regularly. Taking over-the-counter medicines that can help relieve symptoms, such as cramps, aches, pain, headaches, and breast tenderness. Follow these instructions at home: Eating and drinking  Eat  a well-balanced diet. Avoid caffeine and alcohol. Limit the amount of salt and salty foods you eat. This will help reduce bloating. Drink enough fluid to keep your urine pale yellow. Take a multivitamin if told to do so by your health care provider.  Lifestyle  Do not use any products that contain nicotine or tobacco. These products include cigarettes, chewing tobacco, and vaping devices, such as e-cigarettes. If you need help quitting, ask your health care provider. Exercise regularly as suggested by your health care provider. Get enough sleep. For most adults, this is 7-8 hours of sleep each night. Practice relaxation techniques, such as yoga, tai chi, or meditation. Find healthy ways to manage stress.  General instructions  For 2-3 months, write down your symptoms, whether they are mild to severe, and how long they last. This will help your health care provider choose the best treatment for you. Take over-the-counter and prescription medicines only as told by your health care provider. If you are using birth control pills (oral contraceptives), use them as told by your health care provider.  Contact a health care provider if: Your symptoms get worse. You develop new symptoms. You have trouble doing your daily activities. Summary Premenstrual syndrome (PMS) is a group of physical, emotional, and behavioral symptoms that affect women as part of their menstrual cycle. PMS starts 1-2 weeks before the start of a woman's period and goes away a few days after the period starts. PMS is treated by maintaining a healthy lifestyle and taking medicines to relieve the symptoms. This information is not intended to replace advice given to you by your health care provider. Make sure you discuss any questions you have with your healthcare provider. Document Revised: 04/05/2020 Document Reviewed: 04/05/2020 Elsevier Patient Education  2022 Elsevier Inc.  

## 2021-07-09 NOTE — Progress Notes (Signed)
GYNECOLOGY  VISIT   HPI: 25 y.o.   Single White or Caucasian Not Hispanic or Latino  female   [phone] with No LMP recorded. (Menstrual status: Irregular Periods).   here for vaginal pain which has resolved itself since the phone call. Denies any other vaginal concerns.  Feels totally fine today.   Pt also reports she only took OCPs for about 3 weeks but didn't work for her d/t increased depression and SI's. She stopped OCP's, it took a month to feel better. Now she is feeling better. No longer feeling depressed, not suicidal.   She is using condoms.  Her baseline cycles are typically monthly x 3-4 days. She can saturate a pad in 6-8 hours. Can wear one pad all night. Cramps can be mild to moderate. Helped with a heating pad or over the counter medication. She has mild premenstrual cramps for up to a week prior to her cycle.   LMP ~2 weeks ago.   Long h/o bad acne, no hirsutism. Acne is all the time, worse with her cycle.   She gets sad about a week prior to her cycle. Gets better with her cycle.  GYNECOLOGIC HISTORY: No LMP recorded. (Menstrual status: Irregular Periods).  Contraception: none  Menopausal hormone therapy: none         OB History     Gravida  3   Para  1   Term  1   Preterm  0   AB  2   Living  1      SAB  1   IAB  1   Ectopic  0   Multiple  0   Live Births  1              Patient Active Problem List   Diagnosis Date Noted   Environmental and seasonal allergies 01/28/2021   SVD (9/18) 05/19/2020   Second degree laceration of perineum, delivered, current hospitalization 05/19/2020   Post-dates pregnancy 05/18/2020   Nausea and vomiting during pregnancy prior to [redacted] weeks gestation 03/09/2018    Past Medical History:  Diagnosis Date   Amenorrhea    Anemia    Anxiety    Depression     Past Surgical History:  Procedure Laterality Date   DILATION AND CURETTAGE OF UTERUS     WISDOM TOOTH EXTRACTION      Current Outpatient  Medications  Medication Sig Dispense Refill   acetaminophen (TYLENOL) 500 MG tablet Take 1 tablet (500 mg total) by mouth every 6 (six) hours as needed. 30 tablet 0   cetirizine (ZYRTEC) 10 MG tablet Take 1 tablet (10 mg total) by mouth daily. 30 tablet 0   fluticasone (FLONASE) 50 MCG/ACT nasal spray Place 2 sprays into both nostrils daily. 16 g 0   IBUPROFEN PO Take by mouth as needed.     No current facility-administered medications for this visit.     ALLERGIES: Patient has no known allergies.  Family History  Problem Relation Age of Onset   Breast cancer Paternal Grandmother    Cervical cancer Paternal Grandmother     Social History   Socioeconomic History   Marital status: Single    Spouse name: Not on file   Number of children: Not on file   Years of education: Not on file   Highest education level: Not on file  Occupational History   Not on file  Tobacco Use   Smoking status: Never   Smokeless tobacco: Never  Vaping Use  Vaping Use: Never used  Substance and Sexual Activity   Alcohol use: Yes    Comment: rare   Drug use: Yes    Comment: thc & cbd   Sexual activity: Yes    Partners: Male    Birth control/protection: Condom  Other Topics Concern   Not on file  Social History Narrative   Not on file   Social Determinants of Health   Financial Resource Strain: Not on file  Food Insecurity: Not on file  Transportation Needs: Not on file  Physical Activity: Not on file  Stress: Not on file  Social Connections: Not on file  Intimate Partner Violence: Not on file    ROS  PHYSICAL EXAMINATION:    BP 114/68   Pulse 87   Ht 5' 4.75" (1.645 m)   Wt 141 lb 9.6 oz (64.2 kg)   SpO2 98%   BMI 23.75 kg/m     General appearance: alert, cooperative and appears stated age Face: scarring from acne on her face.   1. General counseling and advice on female contraception Discussed options.  Currently using condoms Discussed Ella - ulipristal acetate (ELLA)  30 MG tablet; Take 1 tablet (30 mg total) by mouth once for 1 dose.  Dispense: 1 tablet; Refill: 0 -Information given on paragard, risks and side effects reviewed.   2. Other acne - Testosterone, Total, LC/MS/MS  3. PMDD (premenstrual dysphoric disorder) -Didn't do well with OCP's -Discussed the option of SSRI's, she will reach out if she wants to try it.  -We discussed side effects of SSRI's, she is having some difficulty with orgasms, reading information given  Over 20 minutes in total patient care.

## 2021-07-12 LAB — TESTOSTERONE, TOTAL, LC/MS/MS: Testosterone, Total, LC-MS-MS: 22 ng/dL (ref 2–45)

## 2021-08-20 ENCOUNTER — Ambulatory Visit: Payer: BC Managed Care – PPO | Admitting: Obstetrics and Gynecology

## 2021-08-27 ENCOUNTER — Emergency Department (HOSPITAL_COMMUNITY)
Admission: EM | Admit: 2021-08-27 | Discharge: 2021-08-27 | Disposition: A | Discharge status: Home / Self Care | Payer: BC Managed Care – PPO | Attending: Emergency Medicine | Admitting: Emergency Medicine

## 2021-08-27 ENCOUNTER — Encounter (HOSPITAL_COMMUNITY): Payer: Self-pay | Admitting: *Deleted

## 2021-08-27 DIAGNOSIS — R0602 Shortness of breath: Secondary | ICD-10-CM | POA: Diagnosis present

## 2021-08-27 DIAGNOSIS — U071 COVID-19: Secondary | ICD-10-CM | POA: Insufficient documentation

## 2021-08-27 DIAGNOSIS — Z5321 Procedure and treatment not carried out due to patient leaving prior to being seen by health care provider: Secondary | ICD-10-CM | POA: Insufficient documentation

## 2021-08-27 LAB — RESP PANEL BY RT-PCR (FLU A&B, COVID) ARPGX2
Influenza A by PCR: NEGATIVE
Influenza B by PCR: NEGATIVE
SARS Coronavirus 2 by RT PCR: POSITIVE — AB

## 2021-08-27 MED ORDER — ACETAMINOPHEN 500 MG PO TABS: 1000.0000 mg | ORAL_TABLET | Freq: Once | ORAL | Status: DC

## 2021-08-27 NOTE — ED Provider Notes (Signed)
Emergency Medicine Provider Triage Evaluation Note  Shelby Perez , a 25 y.o. female  was evaluated in triage.  Pt complains of patient here with several days of myalgias cough congestion shortness of breath fatigue and fever.  Has taken no medications.  Is not vaccinated against COVID-19.  Denies any chest pain  Review of Systems  Positive: Cough congestion fevers myalgias Negative: Vomiting  Physical Exam  BP 117/80 (BP Location: Left Arm)    Pulse (!) 102    Temp (!) 100.4 F (38 C) (Oral)    Resp (!) 22    SpO2 97%  Gen:   Awake, no distress   Resp:  Normal effort  MSK:   Moves extremities without difficulty  Other:  Warm to touch, mild tachycardia, lungs clear to auscultation  Medical Decision Making  Medically screening exam initiated at 11:46 AM.  Appropriate orders placed.  Shelby Perez was informed that the remainder of the evaluation will be completed by another provider, this initial triage assessment does not replace that evaluation, and the importance of remaining in the ED until their evaluation is complete.  COVID testing Tylenol   Gailen Shelter, [place] 08/27/21 1147    Franne Forts, DO 08/27/21 [id number]
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
## 2021-08-27 NOTE — ED Notes (Signed)
PT was called at 5:23 for room#28 PT did not answer , PT cannot be found.

## 2022-01-29 ENCOUNTER — Encounter (HOSPITAL_BASED_OUTPATIENT_CLINIC_OR_DEPARTMENT_OTHER): Payer: Self-pay | Admitting: *Deleted

## 2022-01-29 ENCOUNTER — Emergency Department (HOSPITAL_BASED_OUTPATIENT_CLINIC_OR_DEPARTMENT_OTHER)
Admission: EM | Admit: 2022-01-29 | Discharge: 2022-01-29 | Disposition: A | Discharge status: Home / Self Care | Payer: BC Managed Care – PPO | Attending: Emergency Medicine | Admitting: Emergency Medicine

## 2022-01-29 ENCOUNTER — Other Ambulatory Visit: Payer: Self-pay

## 2022-01-29 DIAGNOSIS — S39012A Strain of muscle, fascia and tendon of lower back, initial encounter: Secondary | ICD-10-CM | POA: Insufficient documentation

## 2022-01-29 DIAGNOSIS — Y92410 Unspecified street and highway as the place of occurrence of the external cause: Secondary | ICD-10-CM | POA: Insufficient documentation

## 2022-01-29 DIAGNOSIS — S3992XA Unspecified injury of lower back, initial encounter: Secondary | ICD-10-CM | POA: Diagnosis present
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
## 2022-01-29 NOTE — Discharge Instructions (Addendum)
Your symptoms may worsen tomorrow and over the next few days. Muscle pain is to be expected. Take Tylenol and Motrin and apply heating pad as needed. Return to the ER if you start to experience worsening pain, trouble breathing, trouble walking, severe headache, blurry vision

## 2022-01-29 NOTE — ED Notes (Signed)
Reviewed AVS/discharge instruction with patient. Time allotted for and all questions answered. Patient is agreeable for d/c and escorted to ed exit by staff.  

## 2022-10-28 ENCOUNTER — Inpatient Hospital Stay (HOSPITAL_COMMUNITY)
Admission: AD | Admit: 2022-10-28 | Discharge: 2022-10-28 | Disposition: A | Discharge status: Home / Self Care | Payer: BC Managed Care – PPO | Attending: Obstetrics & Gynecology | Admitting: Obstetrics & Gynecology

## 2022-10-28 ENCOUNTER — Encounter (HOSPITAL_COMMUNITY): Payer: Self-pay

## 2022-10-28 DIAGNOSIS — Z3A12 12 weeks gestation of pregnancy: Secondary | ICD-10-CM | POA: Diagnosis not present

## 2022-10-28 DIAGNOSIS — O26891 Other specified pregnancy related conditions, first trimester: Secondary | ICD-10-CM | POA: Diagnosis not present

## 2022-10-28 DIAGNOSIS — Z711 Person with feared health complaint in whom no diagnosis is made: Secondary | ICD-10-CM | POA: Diagnosis present

## 2022-10-28 NOTE — Discharge Instructions (Addendum)
[place] for [employer] at [facility]             [address], [place], [place] [address] [place] [facility] [place], [address], [place], [place], [address] [phone]  Center for [facility] at [place], [place], [place], [place], [address] [phone]  Center for [facility] at [facility] [address] [place], [place], [place], [address] [phone]  Center for [place] at [facility]                                 [place], [place], [place], [address] [phone]  Center for [place] at [facility]                                    [address] [place], [place], [address] [place] [facility] [place] at [facility] [address], [place], [place], [place], [address]                              [facility]         Phone: [phone]  [facility]      Phone: [facility]      Phone: [place] [facility]         Phone: [phone]   [place] [facility]    Phone: [place]      Phone: [phone]  [facility]     Phone: [phone]  [facility]      Phone: [phone]                   Safe Medications in Pregnancy    Acne: Benzoyl Peroxide Salicylic Acid  Backache/Headache: Tylenol: 2 regular strength every 4 hours OR              2 Extra strength every 6 hours  Colds/Coughs/Allergies: Benadryl (alcohol free) 25 mg every 6 hours as needed Breath right strips Claritin Cepacol throat lozenges Chloraseptic throat spray Cold-Eeze- up  to three times per day Cough drops, alcohol free Flonase (by prescription only) Guaifenesin Mucinex Robitussin DM (plain only, alcohol free) Saline nasal spray/drops Sudafed (pseudoephedrine) & Actifed ** use only after [redacted] weeks gestation and if you do not have high blood pressure Tylenol Vicks Vaporub Zinc lozenges Zyrtec   Constipation: Colace Ducolax suppositories Fleet enema Glycerin suppositories Metamucil Milk of magnesia Miralax Senokot Smooth move tea  Diarrhea: Kaopectate Imodium A-D  *NO pepto Bismol  Hemorrhoids: Anusol Anusol HC Preparation H  Tucks  Indigestion: Tums Maalox Mylanta Zantac  Pepcid  Insomnia: Benadryl (alcohol free) '25mg'$  every 6 hours as needed Tylenol PM Unisom, no Gelcaps  Leg Cramps: Tums MagGel  Nausea/Vomiting:  Bonine Dramamine Emetrol Ginger extract Sea bands Meclizine  Nausea medication to take during pregnancy:  Unisom (doxylamine succinate 25 mg tablets) Take one tablet daily at bedtime. If symptoms are not adequately controlled, the dose can be increased to a maximum recommended dose of two tablets daily (1/2 tablet in the morning, 1/2 tablet mid-afternoon and one at bedtime). Vitamin B6 '100mg'$  tablets. Take one tablet twice a day (up to 200 mg per day).  Skin Rashes: Aveeno products Benadryl cream or '25mg'$  every 6 hours as needed Calamine Lotion 1% cortisone cream  Yeast infection: Gyne-lotrimin 7 Monistat 7   **If taking multiple medications, please check labels to avoid duplicating the same active ingredients **take medication as directed on the label ** Do not exceed 4000 mg of tylenol in 24 hours **Do not take medications that contain aspirin or ibuprofen

## 2022-10-28 NOTE — MAU Note (Signed)
...  Shelby Perez is a 27 y.o. at 28w5dhere in MAU reporting: She reports some of her pregnancy symptoms have been diminishing over the past two weeks. 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 Denies VB. Denies vaginal discharge, vaginal itching, and vaginal odors.  [place] Patient showed RN [name] triage. Patient's name reported clearly. [name] shows embryo.   LMP: Unsure - irregular Onset of complaint: x2 weeks Pain score: Denies pain.  Vitals:   10/28/22 1830  BP: 120/66  Pulse: 97  Resp: 19  Temp: 99.2 F (37.3 C)  SpO2: 98%     FHT: 156 doppler Lab orders placed from triage: none

## 2022-10-28 NOTE — MAU Provider Note (Signed)
History     CSN: [id number]  Arrival date and time: 10/28/22 1749   None     Chief Complaint  Patient presents with   Pregnancy Concern   HPI Shelby Perez is a 27 y.o. [id number] at 23w5dby early ultrasound who presents to MAU for reassurance. Patient is concerned about possible miscarriage as her pregnancy symptoms have been diminishing over the past several weeks. She reports she feels like her breasts are no longer sore, her nausea has resolved and that her belly is smaller. She reports a history of "silent miscarriage" in the past which has her feeling very anxious/worried. She is not having any pain, vaginal bleeding, discharge, or other concerning symptoms. [facility] [place] She has been calling several offices to establish prenatal care, but has not had any luck with them returning her calls.   OB History     Gravida  4   Para  1   Term  1   Preterm  0   AB  2   Living  1      SAB  1   IAB  1   Ectopic  0   Multiple  0   Live Births  1           Past Medical History:  Diagnosis Date   Amenorrhea    Anemia    Anxiety    Depression     Past Surgical History:  Procedure Laterality Date   DILATION AND CURETTAGE OF UTERUS     WISDOM TOOTH EXTRACTION      Family History  Problem Relation Age of Onset   Breast cancer Paternal Grandmother    Cervical cancer Paternal Grandmother     Social History   Tobacco Use   Smoking status: Never   Smokeless tobacco: Never  Vaping Use   Vaping Use: Never used  Substance Use Topics   Alcohol use: Yes    Comment: rare   Drug use: Yes    Comment: thc & cbd    Allergies: No Known Allergies  No medications prior to admission.   Review of Systems  All other systems reviewed and are negative.  Physical Exam   Blood pressure 99/70, pulse 97, temperature 99.2 F (37.3 C), temperature source Oral, resp. rate 19,  height '5\' 6"'$  (1.676 m), weight 67.7 kg, last menstrual period 01/22/2022, SpO2 98 %.  Physical Exam Vitals and nursing note reviewed.  Constitutional:      General: She is not in acute distress. Eyes:     Extraocular Movements: Extraocular movements intact.     Pupils: Pupils are equal, round, and reactive to light.  Cardiovascular:     Rate and Rhythm: Normal rate.  Pulmonary:     Effort: Pulmonary effort is normal. No respiratory distress.  Musculoskeletal:     Cervical back: Normal range of motion.  Neurological:     General: No focal deficit present.     Mental Status: She is alert and oriented to person, place, and time.  Psychiatric:        Mood and Affect: Mood normal.        Behavior: Behavior normal.   FHR: 156 bpm via doppler  MAU Course  Procedures  MDM  Patient reassured with hearing FHR via doppler  Assessment and Plan   1. [redacted] weeks gestation of pregnancy   2. Physically well but worried    -  Discharge home in stable condition - Reasurrance provided. Reviewed normal 2nd trimester symptoms - List of OBGYN's, safe meds given - Patient to return to MAU as needed for new/worsening symptoms - Call any OB to establish prenatal care  Renee Harder, CNM 10/28/2022, 7:57 PM
